# Patient Record
Sex: Female | Born: 1975
Health system: Southern US, Community
[De-identification: ages and names within clinical notes are randomized; demographics above are authoritative.]

## PROBLEM LIST (undated history)

## (undated) DIAGNOSIS — G8929 Other chronic pain: Secondary | ICD-10-CM

## (undated) DIAGNOSIS — S6980XA Other specified injuries of unspecified wrist, hand and finger(s), initial encounter: Secondary | ICD-10-CM

## (undated) DIAGNOSIS — I951 Orthostatic hypotension: Secondary | ICD-10-CM

## (undated) DIAGNOSIS — T4145XA Adverse effect of unspecified anesthetic, initial encounter: Secondary | ICD-10-CM

## (undated) DIAGNOSIS — M549 Dorsalgia, unspecified: Secondary | ICD-10-CM

## (undated) DIAGNOSIS — R51 Headache: Secondary | ICD-10-CM

## (undated) DIAGNOSIS — I499 Cardiac arrhythmia, unspecified: Secondary | ICD-10-CM

## (undated) DIAGNOSIS — G90A Postural orthostatic tachycardia syndrome (POTS): Secondary | ICD-10-CM

## (undated) DIAGNOSIS — E039 Hypothyroidism, unspecified: Secondary | ICD-10-CM

## (undated) DIAGNOSIS — E7800 Pure hypercholesterolemia, unspecified: Secondary | ICD-10-CM

## (undated) DIAGNOSIS — Z9889 Other specified postprocedural states: Secondary | ICD-10-CM

## (undated) DIAGNOSIS — R0683 Snoring: Secondary | ICD-10-CM

## (undated) DIAGNOSIS — K219 Gastro-esophageal reflux disease without esophagitis: Secondary | ICD-10-CM

## (undated) DIAGNOSIS — I498 Other specified cardiac arrhythmias: Secondary | ICD-10-CM

## (undated) DIAGNOSIS — T411X5A Adverse effect of intravenous anesthetics, initial encounter: Secondary | ICD-10-CM

## (undated) DIAGNOSIS — R000 Tachycardia, unspecified: Secondary | ICD-10-CM

## (undated) DIAGNOSIS — H52209 Unspecified astigmatism, unspecified eye: Secondary | ICD-10-CM

## (undated) DIAGNOSIS — R112 Nausea with vomiting, unspecified: Secondary | ICD-10-CM

## (undated) DIAGNOSIS — T8859XA Other complications of anesthesia, initial encounter: Secondary | ICD-10-CM

## (undated) DIAGNOSIS — R05 Cough: Secondary | ICD-10-CM

## (undated) HISTORY — PX: WRIST SURGERY: SHX841

## (undated) HISTORY — PX: BREAST BIOPSY: SHX20

---

## 1998-08-30 ENCOUNTER — Inpatient Hospital Stay (HOSPITAL_COMMUNITY): Admission: AD | Admit: 1998-08-30 | Discharge: 1998-08-30 | Payer: Self-pay | Admitting: Obstetrics

## 1998-09-03 ENCOUNTER — Inpatient Hospital Stay (HOSPITAL_COMMUNITY): Admission: AD | Admit: 1998-09-03 | Discharge: 1998-09-03 | Payer: Self-pay | Admitting: Obstetrics & Gynecology

## 1999-05-28 ENCOUNTER — Other Ambulatory Visit: Admission: RE | Admit: 1999-05-28 | Discharge: 1999-05-28 | Payer: Self-pay | Admitting: Obstetrics & Gynecology

## 2000-06-24 ENCOUNTER — Other Ambulatory Visit: Admission: RE | Admit: 2000-06-24 | Discharge: 2000-06-24 | Payer: Self-pay | Admitting: Obstetrics & Gynecology

## 2000-09-29 ENCOUNTER — Emergency Department (HOSPITAL_COMMUNITY): Admission: EM | Admit: 2000-09-29 | Discharge: 2000-09-30 | Payer: Self-pay | Admitting: Emergency Medicine

## 2000-09-30 ENCOUNTER — Encounter: Payer: Self-pay | Admitting: Emergency Medicine

## 2002-10-25 ENCOUNTER — Other Ambulatory Visit: Admission: RE | Admit: 2002-10-25 | Discharge: 2002-10-25 | Payer: Self-pay | Admitting: Obstetrics and Gynecology

## 2003-04-13 ENCOUNTER — Inpatient Hospital Stay (HOSPITAL_COMMUNITY): Admission: AD | Admit: 2003-04-13 | Discharge: 2003-04-13 | Payer: Self-pay | Admitting: Obstetrics and Gynecology

## 2003-04-20 ENCOUNTER — Inpatient Hospital Stay (HOSPITAL_COMMUNITY): Admission: AD | Admit: 2003-04-20 | Discharge: 2003-04-20 | Payer: Self-pay | Admitting: Obstetrics and Gynecology

## 2003-04-26 ENCOUNTER — Inpatient Hospital Stay (HOSPITAL_COMMUNITY): Admission: AD | Admit: 2003-04-26 | Discharge: 2003-04-26 | Payer: Self-pay | Admitting: *Deleted

## 2003-05-02 ENCOUNTER — Inpatient Hospital Stay (HOSPITAL_COMMUNITY): Admission: AD | Admit: 2003-05-02 | Discharge: 2003-05-07 | Payer: Self-pay | Admitting: Obstetrics and Gynecology

## 2003-05-08 ENCOUNTER — Encounter: Admission: RE | Admit: 2003-05-08 | Discharge: 2003-06-07 | Payer: Self-pay | Admitting: Obstetrics and Gynecology

## 2003-06-13 ENCOUNTER — Other Ambulatory Visit: Admission: RE | Admit: 2003-06-13 | Discharge: 2003-06-13 | Payer: Self-pay | Admitting: Obstetrics and Gynecology

## 2003-07-06 ENCOUNTER — Encounter: Admission: RE | Admit: 2003-07-06 | Discharge: 2003-08-05 | Payer: Self-pay | Admitting: Obstetrics and Gynecology

## 2003-09-05 ENCOUNTER — Encounter: Admission: RE | Admit: 2003-09-05 | Discharge: 2003-10-05 | Payer: Self-pay | Admitting: Obstetrics and Gynecology

## 2003-11-05 ENCOUNTER — Encounter: Admission: RE | Admit: 2003-11-05 | Discharge: 2003-12-05 | Payer: Self-pay | Admitting: Obstetrics and Gynecology

## 2003-12-06 ENCOUNTER — Encounter: Admission: RE | Admit: 2003-12-06 | Discharge: 2004-01-05 | Payer: Self-pay | Admitting: Obstetrics and Gynecology

## 2004-02-05 ENCOUNTER — Encounter: Admission: RE | Admit: 2004-02-05 | Discharge: 2004-03-06 | Payer: Self-pay | Admitting: Obstetrics and Gynecology

## 2004-04-06 ENCOUNTER — Encounter: Admission: RE | Admit: 2004-04-06 | Discharge: 2004-05-06 | Payer: Self-pay | Admitting: Obstetrics and Gynecology

## 2004-05-07 ENCOUNTER — Encounter: Admission: RE | Admit: 2004-05-07 | Discharge: 2004-06-06 | Payer: Self-pay | Admitting: Obstetrics and Gynecology

## 2004-07-05 ENCOUNTER — Encounter: Admission: RE | Admit: 2004-07-05 | Discharge: 2004-07-16 | Payer: Self-pay | Admitting: Obstetrics and Gynecology

## 2004-08-03 ENCOUNTER — Encounter: Admission: RE | Admit: 2004-08-03 | Discharge: 2004-08-03 | Payer: Self-pay | Admitting: *Deleted

## 2004-08-04 ENCOUNTER — Emergency Department (HOSPITAL_COMMUNITY): Admission: EM | Admit: 2004-08-04 | Discharge: 2004-08-04 | Payer: Self-pay | Admitting: Emergency Medicine

## 2004-09-11 ENCOUNTER — Other Ambulatory Visit: Admission: RE | Admit: 2004-09-11 | Discharge: 2004-09-11 | Payer: Self-pay | Admitting: Obstetrics and Gynecology

## 2004-10-24 ENCOUNTER — Ambulatory Visit: Payer: Self-pay | Admitting: Cardiology

## 2004-11-30 ENCOUNTER — Ambulatory Visit: Payer: Self-pay | Admitting: Cardiology

## 2005-07-04 ENCOUNTER — Inpatient Hospital Stay (HOSPITAL_COMMUNITY): Admission: AD | Admit: 2005-07-04 | Discharge: 2005-07-04 | Payer: Self-pay | Admitting: Obstetrics and Gynecology

## 2005-07-30 ENCOUNTER — Inpatient Hospital Stay (HOSPITAL_COMMUNITY): Admission: AD | Admit: 2005-07-30 | Discharge: 2005-07-30 | Payer: Self-pay | Admitting: Obstetrics & Gynecology

## 2006-02-06 ENCOUNTER — Inpatient Hospital Stay (HOSPITAL_COMMUNITY): Admission: AD | Admit: 2006-02-06 | Discharge: 2006-02-09 | Payer: Self-pay | Admitting: Obstetrics and Gynecology

## 2006-03-25 HISTORY — PX: CARDIAC CATHETERIZATION: SHX172

## 2006-08-12 ENCOUNTER — Observation Stay (HOSPITAL_COMMUNITY): Admission: EM | Admit: 2006-08-12 | Discharge: 2006-08-13 | Payer: Self-pay | Admitting: Emergency Medicine

## 2006-08-12 ENCOUNTER — Ambulatory Visit: Payer: Self-pay | Admitting: Cardiovascular Disease

## 2006-09-18 ENCOUNTER — Ambulatory Visit: Payer: Self-pay | Admitting: Cardiology

## 2007-10-29 ENCOUNTER — Encounter: Admission: RE | Admit: 2007-10-29 | Discharge: 2007-10-29 | Payer: Self-pay | Admitting: Cardiology

## 2009-05-01 ENCOUNTER — Ambulatory Visit (HOSPITAL_COMMUNITY): Admission: RE | Admit: 2009-05-01 | Discharge: 2009-05-01 | Payer: Self-pay | Admitting: Obstetrics and Gynecology

## 2009-05-01 HISTORY — PX: INTRAUTERINE DEVICE INSERTION: SHX323

## 2009-05-01 HISTORY — PX: DILATION AND CURETTAGE OF UTERUS: SHX78

## 2009-05-01 HISTORY — PX: IUD REMOVAL: SHX5392

## 2009-06-06 ENCOUNTER — Encounter: Admission: RE | Admit: 2009-06-06 | Discharge: 2009-06-06 | Payer: Self-pay | Admitting: Allergy

## 2010-06-13 LAB — CBC
HCT: 42.6 % (ref 36.0–46.0)
MCV: 93.6 fL (ref 78.0–100.0)
Platelets: 246 10*3/uL (ref 150–400)

## 2010-06-13 LAB — PREGNANCY, URINE: Preg Test, Ur: NEGATIVE

## 2010-07-24 ENCOUNTER — Emergency Department (HOSPITAL_COMMUNITY): Payer: 59

## 2010-07-24 ENCOUNTER — Emergency Department (HOSPITAL_COMMUNITY)
Admission: EM | Admit: 2010-07-24 | Discharge: 2010-07-25 | Disposition: A | Discharge status: Home / Self Care | Payer: 59 | Attending: Emergency Medicine | Admitting: Emergency Medicine

## 2010-07-24 DIAGNOSIS — Z79899 Other long term (current) drug therapy: Secondary | ICD-10-CM | POA: Insufficient documentation

## 2010-07-24 DIAGNOSIS — R0602 Shortness of breath: Secondary | ICD-10-CM | POA: Insufficient documentation

## 2010-07-24 DIAGNOSIS — K219 Gastro-esophageal reflux disease without esophagitis: Secondary | ICD-10-CM | POA: Insufficient documentation

## 2010-07-24 DIAGNOSIS — R0609 Other forms of dyspnea: Secondary | ICD-10-CM | POA: Insufficient documentation

## 2010-07-24 DIAGNOSIS — R0989 Other specified symptoms and signs involving the circulatory and respiratory systems: Secondary | ICD-10-CM | POA: Insufficient documentation

## 2010-07-24 DIAGNOSIS — R079 Chest pain, unspecified: Secondary | ICD-10-CM | POA: Insufficient documentation

## 2010-07-24 LAB — POCT I-STAT, CHEM 8
BUN: 10 mg/dL (ref 6–23)
Calcium, Ion: 1.19 mmol/L (ref 1.12–1.32)
Chloride: 107 mEq/L (ref 96–112)
Glucose, Bld: 92 mg/dL (ref 70–99)
Hemoglobin: 15 g/dL (ref 12.0–15.0)
Potassium: 4.1 mEq/L (ref 3.5–5.1)
Sodium: 140 mEq/L (ref 135–145)

## 2010-07-24 LAB — POCT PREGNANCY, URINE: Preg Test, Ur: NEGATIVE

## 2010-07-24 LAB — POCT CARDIAC MARKERS
CKMB, poc: <1 ng/mL — ABNORMAL LOW (ref 1.0–8.0)
Myoglobin, poc: 28.9 ng/mL (ref 12–200)
Troponin i, poc: <0.05 ng/mL (ref 0.00–0.09)

## 2010-07-24 LAB — DIFFERENTIAL
Basophils Absolute: 0 10*3/uL (ref 0.0–0.1)
Basophils Relative: 1 % (ref 0–1)
Eosinophils Absolute: 0.2 10*3/uL (ref 0.0–0.7)
Eosinophils Relative: 3 % (ref 0–5)
Monocytes Absolute: 0.5 10*3/uL (ref 0.1–1.0)
Monocytes Relative: 7 % (ref 3–12)
Neutro Abs: 4.2 10*3/uL (ref 1.7–7.7)

## 2010-07-24 LAB — D-DIMER, QUANTITATIVE: D-Dimer, Quant: 0.67 ug/mL-FEU — ABNORMAL HIGH (ref 0.00–0.48)

## 2010-07-24 MED ORDER — IOHEXOL 300 MG/ML  SOLN
100.0000 mL | Freq: Once | INTRAMUSCULAR | Status: AC | PRN
  Administered 2010-07-24: 100 mL via INTRAVENOUS

## 2010-08-07 NOTE — Assessment & Plan Note (Signed)
[facility]                            CARDIOLOGY OFFICE NOTE   CHAYLEE, EHRSAM                       MRN:          [id number]  DATE:09/18/2006                            DOB:          03-26-1975    HISTORY OF PRESENT ILLNESS:  Mrs. Selmer returns today post  hospitalization.  Please see the discharge summary for complete details.  She ruled out for myocardial infarction.  Her EKG which apparently shows  some T wave inversion.  She did one on herself at the EMS station,  resolved to normal.   She had an exercise rest stress Myoview which was remarkable for an EF  of 57% with no scar ischemia.  Her stress portion indicated some  physical deconditioning, but appropriate blood pressure response to  exercise.   She also had a 2D echocardiogram which showed an EF of 65% with no LVH  and no pericardial effusion or valvular abnormalities.   She was seen by Dr. Anne Hahn for some dizziness and migraine headaches.  She was placed on Lyrica 50 mg q.h.s.   She would like to take Maxalt p.r.n. for migraines.  She has had chest  pain with Maxalt on only one occasion, though she has taken it multiple  times.   We talked at length about the risk of potential coronary spasm with  Maxalt, but I told her it was probably okay to take as long as she was  not having chest pain.   She had multiple questions about other issues including the dizzy  spells, tingling in her fingers which I think is probably  hyperventilation.  We talked about stress reduction.  We talked about  tobacco cessation at length.  At the end of her visit, we prescribed  Chantix starter pack.   PHYSICAL EXAMINATION:  VITAL SIGNS:  Blood pressure was 140/89, today  her pulse was 96 and regular.  Her weight was 173.  HEART:  Her exam was unremarkable in that she had a normal S2, S2.  LUNGS:  Clear.  Carotids were full.  There was no thyromegaly.  EXTREMITIES:  No edema.  Pulses were  present.   ASSESSMENT/RECOMMENDATIONS:  At this point in time, I have offered  reassurance.  I have asked her to consider stress reduction counseling.  I think Maxalt is reasonable as long as  it is not causing any chest pain.  I have asked her to get into a  regular exercise program to improve her conditioning.  I plan on seeing  her back on a p.r.n. basis.     Thomas C. Daleen Squibb, MD, [facility]  Electronically Signed    TCW/MedQ  DD: 09/18/2006  DT: 09/19/2006  Job #: [id number]   cc:   Marcelino Duster L. Vincente Poli, M.D.

## 2010-08-07 NOTE — Discharge Summary (Signed)
[name], [name] NO.:  [id number]   MEDICAL RECORD NO.:  [id number]          PATIENT TYPE:  INP   LOCATION:  6526                         FACILITY:  [facility]   PHYSICIAN:  Bevelyn Buckles. Bensimhon, MDDATE OF BIRTH:  Mar 17, 1976   DATE OF ADMISSION:  08/12/2006  DATE OF DISCHARGE:  08/13/2006                               DISCHARGE SUMMARY   PROCEDURES:  1. Exercise treadmill Myoview.  2. A 2-D echocardiogram.   PRIMARY DIAGNOSES:  1. Chest pain.  2. Cardiac enzymes negative for an myocardial infarction.  3. Exercise Myoview negative for scar/ischemia.   SECONDARY DIAGNOSES:  1. Preserved left ventricular function with an ejection fraction of      57% at Myoview.  2. Dizziness.  3. History of migraines.  4. History of lower extremity edema.  5. Status post C-section x2.  6. Ongoing tobacco use.  7. History of palpitations and tachycardia.  8. Obesity.  9. Gastroesophageal reflux disease.  10.Family history of premature coronary artery disease.   TIME AT DISCHARGE:  33 minutes.   HOSPITAL COURSE:  [name] is a 35 year old female with no previous  history of coronary artery disease.  She was evaluated by Dr. Daleen Squibb for  palpitations, but no medical therapy was indicated.  She has had issues  with dizziness as well as weakness, fatigue, dyspnea on exertion; and  daily headaches.  She has seen Dr. Orlin Hilding in the past.  She has also  been evaluated by staff at [facility].  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   Her cardiac enzymes were negative for MI.  A stress Myoview was  performed which showed no scar or ischemia and an EF of 57%.  There was  concern for LVH on her EKG; and an echocardiogram was performed which  showed an EF of 65% with normal wall thickness and no pericardial  effusion as well as no valvular  abnormalities.   [name] was seen by neurology because of the headaches.  Dr. Anne Hahn  felt that Lyrica 50 mg q.h.s. was appropriate therapy for this; and this  was started.  She is tolerating this well.  She had also been given a  prescription for Topamax, in the past; but states that she had not  gotten this filled as yet.  She understands that she can take one or the  other, but should not combine them.  She will follow up with Dr. Orlin Hilding  as scheduled.  On 08/13/2006 [name] was feeling better and had no  further episodes of chest pain.  She had no chest pain during the stress  portion of her Myoview.  She was evaluated by Dr. Gala Romney and  considered stable for discharge with outpatient follow-up arranged.   LABORATORY VALUES:  Hemoglobin 14.5, hematocrit 42.8, WBC 6.9, platelets  282.  D-dimer less than 0.22.  Sodium 138, potassium 3.5, chloride 104,  CO2 27, BUN 7, creatinine 0.75, glucose  100.  TSH 2.748.  T4 7.0.   DISCHARGE INSTRUCTIONS:  1. Her activity level is to be increased gradually.  2. She is to stick to a low sodium heart healthy diet.  3. She is to followup with Dr. Daleen Squibb on June 6 at 4:00 p.m.  4. She is to followup with Harrietta Guardian, [facility] at [facility].  5. She is to followup with Dr. Orlin Hilding as well.   DISCHARGE MEDICATIONS:  1. Prilosec 40 mg q.h.s.  2. Lyrica 50 mg q.h.s.  3. Topamax as previously prescribed.  4. Aspirin 81 mg daily.  5. Nicotine patch 14 mg daily, and tapered to discontinuation.  6. HCTZ and Motrin as prior to admission.  7. She understands that she is not to use tobacco or caffeine.      Theodore Demark, PA-C      Bevelyn Buckles. Bensimhon, MD  Electronically Signed    RB/MEDQ  D:  08/13/2006  T:  08/13/2006  Job:  [id number]   cc:   Santina Evans A. Orlin Hilding, M.D.  [facility] Harrietta Guardian, [facility]

## 2010-08-07 NOTE — H&P (Signed)
[name], [name] NO.:  [id number]   MEDICAL RECORD NO.:  [id number]          PATIENT TYPE:  INP   LOCATION:  1846                         FACILITY:  [facility]   PHYSICIAN:  Kara Pick. 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  She came to the emergency room with  multiple symptomatic complaints, these included chest pain, the pain is  atypical.  It has lasted for about a month and a half.  It increased  over the last week.  It is substernal.  It has radiated to the left side  of her chest.  She has done multiple EKGs on herself in the last week or  two.  I reviewed four of them, they were all totally normal.  She was  concerned about an inverted T-wave in V1 which was normal.  She was also  concerned about some nonspecific changes in lead 3.   The patient also has had some shortness of breath.  The shortness of  breath is exertional.  There is no pleuritic component.  There has been  no fever or sputum production.  She has no chronic lung disease and is a  nonsmoker.  The shortness of breath is not associated with chest pain.  She seems to think that this is a new symptom in the last week.   She has also had dizziness.  She has seen Dr. __________ and some of the  doctors at Urgent Care for this.  She was to follow up with neurology  [name].   The dizziness is not vertigo, she feels like she is going to fall one  way or the other when she closes her eyes.  There has been no associated  nausea or vomiting.  She does have a history of migraines.  She  essentially says she has a headache every day since the birth of her  second child six months ago.  She had been on Topamax in the past but  stopped this and takes Maxalt now for her migraines.   REVIEW OF  SYSTEMS:  Otherwise unremarkable.   MEDICATIONS:  Her only medications are Prilosec p.r.n.,  hydrochlorothiazide p.r.n. for edema and Motrin for pain.   ALLERGIES:  No known drug allergies.   PAST MEDICAL HISTORY:  Primarily remarkable for two c-sections which  were uncomplicated.   SOCIAL HISTORY:  The patient is happily married.  She has a 44-year-old  and a 72-month-old.  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  Her  mother was with her today.  The patient does not drink or smoke.  She is  fairly sedentary outside her work.   FAMILY HISTORY:  Noncontributory.   About a year and a half ago she had some chest pain.  She apparently was  referred to [facility] for a 2D echocardiogram.  She says that this  was normal.  She was subsequently referred to [name] for workup.  At  that time she had some palpitations and tachycardia.  Again, as far as I  can tell the workup was negative  but we will need to see if we can  office records on that.   PHYSICAL EXAMINATION:  GENERAL:  The patient's exam is remarkable for a  fairly young female in no distress.  Affect is appropriate.  VITAL SIGNS:  Blood pressure 120/70, she is not postural.  Pulse is 70  and regular.  She is afebrile.  Respiratory rate is 14.  Weight is not  recorded.  HEENT:  Normal.  NECK:  Supple.  There is no thyromegaly or lymphadenopathy, jugular  venous pressure elevation or bruits.  LUNGS:  Clear with no wheezing and normal diaphragmatic motion.  There  is an S1, S2 with normal heart sounds.  PMI is nonpalpable.  ABDOMEN:  Benign.  There is no epigastric pain, no hepatosplenomegaly,  no hepatojugular reflux.  There is no abdominal aortic aneurysm and no  bruits.  EXTREMITIES:  Distal pulses are intact.  No edema.  NEUROLOGIC:  Nonfocal.  There are no varicosities or lymphedema.  MUSCULOSKELETAL:  Normal.  There is no weakness.   DIAGNOSTIC STUDIES:  Her EKG is entirely normal.  Her chest x-ray shows  no  active disease.   LABORATORY DATA:  Pending.  Cardiac enzymes are negative.   IMPRESSION:  The patient presents with multiple issues.  Her chest pain  is atypical.  Her EKGs are normal.  I think it is reasonable for her to  have a stress Myoview study.  Unfortunately we do not have a tech right  now who can do a cardiac CT on her and there is no physician available  tomorrow.   I will not start her on any anticoagulants.  We will treat her pain with  Motrin and aspirin.   In regards to the patient's dizziness, she needs a head CT to rule out  any CNS mass.  We will order this tonight without contrast.   We will ask neurology to see her in-house since she has had constant  headaches for six months and has a history of migraines and has been  having dizziness.   In regards to her shortness of breath, it appears functional.  We will  check a 2D echocardiogram to make sure that there is no left ventricular  dysfunction or occult valvular disease.   Hopefully, neurology can see the patient and we can do her CT scan  tonight and functional assessment of her heart tomorrow with possible  discharge later tomorrow.   The patient and her mom seem happy with these plans.           ______________________________  Kara Pick Eden Emms, MD, [facility]     PCN/MEDQ  D:  08/12/2006  T:  08/12/2006  Job:  [id number]
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
## 2010-08-10 NOTE — Op Note (Signed)
NAMELAKEYN, DOKKEN                ACCOUNT NO.:  [id number]   MEDICAL RECORD NO.:  [id number]          PATIENT TYPE:  INP   LOCATION:  9104                          FACILITY:  [facility]   PHYSICIAN:  Michelle L. Grewal, M.D.DATE OF BIRTH:  March 21, 1976   DATE OF PROCEDURE:  02/06/2006  DATE OF DISCHARGE:  02/09/2006                               OPERATIVE REPORT   PREOPERATIVE DIAGNOSES:  1. Intrauterine pregnancy at 38 weeks.  2. Hyperemesis.  3. Previous cesarean section.   POSTOPERATIVE DIAGNOSES:  1. Intrauterine pregnancy at 38 weeks.  2. Hyperemesis.  3. Previous cesarean section.   PROCEDURE:  Repeat low transverse cesarean section.   SURGEON:  Dr. Vincente Poli.   ANESTHESIA:  Spinal.   SPECIMENS:  Female infant.  Apgars 9 at one minute, 9 at five minutes.   COMPLICATIONS:  None.   PROCEDURE:  Patient was taken to the operating room.  She was given her  spinal without incident.  She was prepped and draped in the usual  sterile fashion. A Foley catheter was inserted.  A low transverse  incision was made and carried down to the fascia.  The fascia was scored  in the midline and extended laterally.  The rectus muscles were  separated in the midline.  The peritoneum was entered bluntly.  The  peritoneal incision was then stretched.  The bladder blade was inserted.  The lower uterine segment was identified.  The bladder flap was  developed sharply and then digitally.  The bladder blade was readjusted.  A low transverse incision was made in the uterus.  The uterus was  entered using a hemostat.  The baby was a female infant and delivered  quite easily.  She weighted 7 pounds 0 ounces.  She was delivered at  1339.  Apgars were 9 at one minute and 9 at five minutes.  The cord was  clamped and cut, and the baby was handed to the waiting neonatology team  and then taken to the newborn nursery.  The placenta was manually  removed, noted to be normal intact with three-vessel cord.  Uterus  was  exteriorized and cleared of all clots and debris.  The uterine incision  was closed in one layer using 0 chromic continuous running locked  stitch.  It was hemostatic.  The uterus was returned to the abdomen.  Irrigation was performed.  Hemostasis was again noted to be excellent.  The peritoneum and rectus muscles were reapproximated using 0 Vicryl.  The fascia was then closed  using 0 Vicryl starting at each corner and meeting in the midline.  After irrigation of the subcutaneous layers, the skin was closed with  staples.  All sponge, lap and instrument counts were correct x2.  The  patient went to recovery room in stable condition.      Michelle L. Vincente Poli, M.D.  Electronically Signed     MLG/MEDQ  D:  03/03/2006  T:  03/03/2006  Job:  [id number]
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
## 2010-08-10 NOTE — Discharge Summary (Signed)
[name], [name] NO.:  [id number]   MEDICAL RECORD NO.:  [id number]          PATIENT TYPE:  INP   LOCATION:  9104                          FACILITY:  [facility]   PHYSICIAN:  Dineen Kid. Rana Snare, M.D.    DATE OF BIRTH:  [birth year]   DATE OF ADMISSION:  02/06/2006  DATE OF DISCHARGE:  02/09/2006                               DISCHARGE SUMMARY   ADMITTING DIAGNOSIS:  1. Intrauterine pregnancy at 12 weeks estimated gestational age.  2. Previous cesarean section, desired repeat.   DISCHARGE DIAGNOSES:  1. Status post low transverse cesarean section.  2. Viable female infant.   PROCEDURE:  Repeat low transverse cesarean section.   REASON FOR ADMISSION:  Please see written H&P.   HOSPITAL COURSE:  The patient is a 35 year old gravida 6, para 1 that  presented to [facility] for a scheduled cesarean  section.  The patient had had a previous cesarean section and desires  repeat.  On the morning of admission, the patient was taken to the  operating room where spinal anesthesia was administered without  difficulty.  A low transverse incision was made with the delivery of a  viable female infant, weighing 7 pounds 0 ounces, with Apgar's of 9 at  one minute and 9 at five minutes.  The patient tolerated the procedure  well and was taken to recovery room in stable condition.  Postoperative day #1, the patient was without complaint.  Vital signs  remained stable.  Abdomen soft with good return of bowel function.  Fundus is firm and nontender.  Abdominal dressing was noted with a scant  amount of old drainage noted on bandage.  Foley had been discontinued.  Laboratory findings revealed hemoglobin of 9.3, platelet count of  205,000, WBC count 10.3.  On postoperative day #2, the patient did complain of some nasal  stuffiness.  Vital signs were otherwise stable.  She was afebrile.  Abdomen soft.  Fundus firm and nontender.  Abdominal dressing had been  removed  revealing an incision that was clean, dry and intact.  The  patient was ambulating well, tolerating a regular diet without complaint  of nausea, vomiting.  Postoperative day #3, the patient was without complaint.  Vital signs  remained stable.  She was afebrile.  Fundus firm and nontender.  Incision was clean, dry and intact.  Staples removed and the patient was  discharged home.   CONDITION ON DISCHARGE:  Good.   DIET:  Regular as tolerated.   ACTIVITY:  No heavy lifting, no driving x2 weeks, no vaginal entry.   FOLLOWUP:  The patient is to follow up in the office in 1-2 weeks for an  incision check.  She is to call for temperature greater than 100  degrees, persistent nausea, vomiting, heavy vaginal bleeding, and/or  redness or drainage from incisional site.   DISCHARGE MEDICATIONS:  1. Tylox, #30, one p.o. every four to six hours.  2. Motrin 600 mg every 6 hours.  3. Prenatal vitamins one p.o. daily.  4. Colace one p.o. daily p.r.n.      Julio Sicks,  N.P.      Dineen Kid. Rana Snare, M.D.  Electronically Signed    CC/MEDQ  D:  02/25/2006  T:  02/25/2006  Job:  [id number]

## 2010-08-10 NOTE — Discharge Summary (Signed)
[name], [name]                          ACCOUNT NO.:  [id number]   MEDICAL RECORD NO.:  [id number]                   PATIENT TYPE:  INP   LOCATION:  9145                                 FACILITY:  [facility]   PHYSICIAN:  Guy Sandifer. Henderson Cloud, M.D.              DATE OF BIRTH:  03-30-1975   DATE OF ADMISSION:  05/02/2003  DATE OF DISCHARGE:  05/07/2003                                 DISCHARGE SUMMARY   ADMITTING DIAGNOSES:  1. Intrauterine pregnancy at 37 weeks estimated gestational age.  2. Induction of labor secondary to pregnancy-induced hypertension.   DISCHARGE DIAGNOSES:  1. Status post low transverse cesarean section secondary to failure to     progress.  2. Viable female infant.   PROCEDURE:  Primary low transverse cesarean section.   REASON FOR ADMISSION:  Please see dictated H&P.   HOSPITAL COURSE:  The patient was a 35 year old gravida 5 para 0 that was  admitted to [facility] at 37 weeks estimated gestational  age for an induction of labor due to elevated blood pressure.  The patient  had been followed closely in the office over the last 2-3 weeks and was  noted to have blood pressures of 154 over 70s to 80s with trace of  proteinuria.  The patient had been placed on modified bedrest for mild  toxemia.  She presents on admission for a two-stage induction of labor.  On  the evening of her admission Cytotec was administered for cervical ripening.  Cervix was known to be closed, 30% effaced, vertex was high in the pelvis.  Fetal heart tones were reactive.  PIH labs were drawn which were within  normal limits.  On the following morning the patient was somewhat  comfortable, vital signs were stable, fetal heart tones continued to be  reactive.  Contractions were noted to be irregular.  Cervix was now dilated  to 1 cm, 50% effaced, with vertex at a -2 station.  Artificial rupture of  membranes was performed revealing clear fluid.  Labor was now augmented with  Pitocin.  Epidural was placed for the patient's comfort.  The patient  labored all day without significant change in cervix.  The patient did  progress to 3 cm dilated; however, no further progression after 4 hours with  adequate labor as measured by [place] units.  The decision was then made  to proceed with a low transverse cesarean section.  The patient was then  transferred to the operating room where epidural was dosed to an adequate  surgical level. A  low transverse incision was made with the delivery of a  viable female infant weighing 7 pounds 1 ounce with Apgars of 9 at one minute  and 9 at five minutes.  The patient tolerated the procedure well and was  taken to the recovery room in stable condition.  On postoperative day #1  vital signs were stable; she was  afebrile.  Abdomen was soft with good  return of bowel function.  Abdominal dressing was noted to be clean, dry,  and intact.  Labs revealed hemoglobin of 7.2.  The patient was placed on  iron supplementation.  On postoperative day #2 the patient was without  complaint.  Vital signs were stable; she was afebrile.  Fundus was firm and  nontender.  Incision was clean, dry, and intact.  The patient was ambulating  well.  On postoperative day #3 the patient did complain of some headache.  She denied epigastric pain.  Vital signs were stable, blood pressure 152/93.  Abdomen was soft, fundus was firm and nontender.  Incision was clean, dry,  and intact.  Deep tendon reflexes were 2+.  PIH labs were drawn and pain  medication was changed to Vicodin.  On postoperative day #4 the patient did  continue to complain of a headache.  She continued to deny visual  disturbance or right upper quadrant pain.  Vital signs were stable.  Incision was clean, dry, and intact.  Staples were removed and the patient  was discharged home.   CONDITION ON DISCHARGE:  Stable.   DIET:  Regular as tolerated..   ACTIVITY:  No heavy lifting, no driving  x2 weeks, no vaginal entry.   FOLLOW-UP:  The patient is to follow up in the office in 1 week for an  incision check.  She is to call for temperature greater than 100 degrees,  persistent nausea and vomiting, heavy vaginal bleeding, and/or redness or  drainage from the incisional site.   DISCHARGE MEDICATIONS:  1. Vicodin #30 one p.o. 46 p.r.n.  2. Ferrous sulfate 325 mg one p.o. b.i.d. with meals.  3. Prenatal vitamins one p.o. daily.  4. Ibuprofen 600 mg q.6h.  5. Colace one p.o. daily p.r.n.     Julio Sicks, N.P.                        Guy Sandifer. Henderson Cloud, M.D.    CC/MEDQ  D:  06/03/2003  T:  06/03/2003  Job:  [id number]

## 2010-08-10 NOTE — H&P (Signed)
[name], [name] NO.:  [id number]   MEDICAL RECORD NO.:  [id number]                   PATIENT TYPE:   LOCATION:                                       FACILITY:   PHYSICIAN:  Dineen Kid. Rana Snare, M.D.                 DATE OF BIRTH:   DATE OF ADMISSION:  DATE OF DISCHARGE:                                HISTORY & PHYSICAL   HISTORY OF PRESENT ILLNESS:  [name] is a 35 year old G5 P0 A4 at [redacted] weeks  gestational age who presents for induction of labor due to elevated blood  pressures.  Her blood pressures over the last 2-3 weeks have been elevated  to the range of 154 over 70s to 80s with a trace protein.  She has been on  modified bedrest for mild toxemia and presents for two-stage induction.  She  had a ultrasound on May 01, 2003 which shows a vertex fetus with a  biophysical of 8/8 and an amniotic fluid index of 12.1.  Her estimated date  of confinement is May 23, 2003 and her group B strep was done at the  hospital and the results are not immediately available.   PHYSICAL EXAMINATION:  VITAL SIGNS:  Blood pressure 148/82.  HEART:  Regular rate and rhythm.  LUNGS:  Clear to auscultation bilaterally.  ABDOMEN:  Gravid, nontender.  Fundal height is 37 cm.  PELVIC:  Cervix is closed, 30%, and high.   MEDICATIONS:  Prenatal vitamins.   ALLERGIES:  No known drug allergies.   IMPRESSION AND PLAN:  Intrauterine pregnancy at 37 weeks with preeclampsia.  Plan two-stage induction of labor with Cytotec cervical ripening followed by  Pitocin and amniotomy.  Will check preeclampsia labs on admission.  At this  time without CNS irritability will plan to hold off on magnesium sulfate.                                               Dineen Kid Rana Snare, M.D.    DCL/MEDQ  D:  05/02/2003  T:  05/02/2003  Job:  [id number]

## 2010-08-10 NOTE — Op Note (Signed)
[name], [name]                          ACCOUNT NO.:  [id number]   MEDICAL RECORD NO.:  [id number]                   PATIENT TYPE:  INP   LOCATION:  9145                                 FACILITY:  [facility]   PHYSICIAN:  Michelle L. Vincente Poli, M.D.            DATE OF BIRTH:  [birth year]   DATE OF PROCEDURE:  05/03/2003  DATE OF DISCHARGE:                                 OPERATIVE REPORT   PREOPERATIVE DIAGNOSIS:  1. Intrauterine pregnancy at term.  2. Pregnancy induced hypertension.  3. Arrested dilation at 3 cm.   POSTOPERATIVE DIAGNOSIS:  1. Intrauterine pregnancy at term.  2. Pregnancy induced hypertension.  3. Arrested dilation at 3 cm.   PROCEDURE:  Primary low transverse cesarean section.   SURGEON:  Michelle L. Vincente Poli, M.D.   ANESTHESIA:  Epidural.   ESTIMATED BLOOD LOSS:  700 mL.   FINDINGS:  A female infant with Apgars 8 at 1 minute and 9 at 5 minutes in  cephalic presentation.   PROCEDURE:  The patient was taken to the operating room where epidural was  dosed and found to be adequate.  She was then prepped and draped in the  usual sterile fashion.  A Foley catheter was inserted into the bladder.  After a sterile drape was applied, scalpel was used to make a low transverse  incision and carried down to the fascia.  The fascia was scored in the  midline and extended laterally.  The Pfannenstiel incision was developed.  The rectus muscles were separated in the midline and the peritoneum was  entered bluntly.  The peritoneal incision was stretched.  The bladder blade  was inserted and the lower uterine segment was identified.  The bladder  blade was readjusted.  A low transverse incision was made in the uterus and  the uterus was entered with a hemostat.  The amniotic fluid was clear.  The  baby was delivered with one easy pull of the vacuum, was a female infant with  Apgars 8 at 1 minute and 9 at 5 minutes.  The cord was clamped and cut, the  baby was handed to the  awaiting pediatrician.  The placenta was manually  removed, noted to be normal and intact.  The uterus was exteriorized and was  cleared of all clots and debris.  The uterine incision was closed in a  single layer using 0 chromic in a continuous running locked stitch.  A  second layer was used, as well.  It was hemostatic.  The uterus was returned  to the abdomen.  Irrigation was performed, hemostasis was noted.  The  peritoneum was closed using 0 Vicryl in a continuous running stitch, the  rectus muscles were reapproximated using 0 Vicryl.  The fascia was closed  using 0 Vicryl continuous  running stitch starting at each corner and meeting in the midline.  After  irrigation of the subcutaneous layer, the skin was closed  with staples.  All  sponge, lap, and instrument counts were correct x 2.  The patient tolerated  the procedure well and went to the recovery room in stable condition.                                               Michelle L. Vincente Poli, M.D.    Florestine Avers  D:  05/03/2003  T:  05/03/2003  Job:  [id number]

## 2011-06-13 ENCOUNTER — Other Ambulatory Visit: Payer: Self-pay | Admitting: Obstetrics and Gynecology

## 2011-08-06 ENCOUNTER — Other Ambulatory Visit: Payer: Self-pay | Admitting: Surgery

## 2011-12-19 ENCOUNTER — Other Ambulatory Visit: Payer: Self-pay | Admitting: Orthopedic Surgery

## 2011-12-24 ENCOUNTER — Encounter (HOSPITAL_BASED_OUTPATIENT_CLINIC_OR_DEPARTMENT_OTHER): Payer: Self-pay | Admitting: *Deleted

## 2011-12-24 DIAGNOSIS — S6980XA Other specified injuries of unspecified wrist, hand and finger(s), initial encounter: Secondary | ICD-10-CM

## 2011-12-24 DIAGNOSIS — R059 Cough, unspecified: Secondary | ICD-10-CM

## 2011-12-24 HISTORY — DX: Cough, unspecified: R05.9

## 2011-12-24 HISTORY — DX: Other specified injuries of unspecified wrist, hand and finger(s), initial encounter: S69.80XA

## 2011-12-24 NOTE — Pre-Procedure Instructions (Addendum)
Office note and any cardiac testing req. from  [facility] to come for BMET and EKG

## 2011-12-26 ENCOUNTER — Encounter (HOSPITAL_BASED_OUTPATIENT_CLINIC_OR_DEPARTMENT_OTHER)
Admission: RE | Admit: 2011-12-26 | Discharge: 2011-12-26 | Disposition: A | Discharge status: Home / Self Care | Payer: Worker's Compensation | Source: Ambulatory Visit | Attending: Orthopedic Surgery | Admitting: Orthopedic Surgery

## 2011-12-26 LAB — BASIC METABOLIC PANEL
CO2: 23 mEq/L (ref 19–32)
Calcium: 9.7 mg/dL (ref 8.4–10.5)
Creatinine, Ser: 0.84 mg/dL (ref 0.50–1.10)
Glucose, Bld: 117 mg/dL — ABNORMAL HIGH (ref 70–99)

## 2011-12-31 ENCOUNTER — Encounter (HOSPITAL_BASED_OUTPATIENT_CLINIC_OR_DEPARTMENT_OTHER)
Admission: RE | Disposition: A | Discharge status: Home / Self Care | Payer: Self-pay | Source: Ambulatory Visit | Attending: Orthopedic Surgery

## 2011-12-31 ENCOUNTER — Encounter (HOSPITAL_BASED_OUTPATIENT_CLINIC_OR_DEPARTMENT_OTHER): Payer: Self-pay | Admitting: *Deleted

## 2011-12-31 ENCOUNTER — Ambulatory Visit (HOSPITAL_BASED_OUTPATIENT_CLINIC_OR_DEPARTMENT_OTHER)
Admission: RE | Admit: 2011-12-31 | Discharge: 2012-01-01 | Disposition: A | Discharge status: Home / Self Care | Payer: Worker's Compensation | Source: Ambulatory Visit | Attending: Orthopedic Surgery | Admitting: Orthopedic Surgery

## 2011-12-31 ENCOUNTER — Encounter (HOSPITAL_BASED_OUTPATIENT_CLINIC_OR_DEPARTMENT_OTHER): Payer: Self-pay | Admitting: Anesthesiology

## 2011-12-31 ENCOUNTER — Ambulatory Visit (HOSPITAL_BASED_OUTPATIENT_CLINIC_OR_DEPARTMENT_OTHER): Payer: Worker's Compensation | Admitting: Anesthesiology

## 2011-12-31 DIAGNOSIS — Z01812 Encounter for preprocedural laboratory examination: Secondary | ICD-10-CM | POA: Insufficient documentation

## 2011-12-31 DIAGNOSIS — IMO0002 Reserved for concepts with insufficient information to code with codable children: Secondary | ICD-10-CM | POA: Insufficient documentation

## 2011-12-31 DIAGNOSIS — M24139 Other articular cartilage disorders, unspecified wrist: Secondary | ICD-10-CM | POA: Insufficient documentation

## 2011-12-31 HISTORY — DX: Other chronic pain: G89.29

## 2011-12-31 HISTORY — DX: Dorsalgia, unspecified: M54.9

## 2011-12-31 HISTORY — DX: Cough: R05

## 2011-12-31 HISTORY — DX: Unspecified astigmatism, unspecified eye: H52.209

## 2011-12-31 HISTORY — PX: WRIST ARTHROSCOPY: SHX838

## 2011-12-31 HISTORY — DX: Orthostatic hypotension: I95.1

## 2011-12-31 HISTORY — DX: Tachycardia, unspecified: R00.0

## 2011-12-31 HISTORY — DX: Snoring: R06.83

## 2011-12-31 HISTORY — DX: Headache: R51

## 2011-12-31 HISTORY — DX: Other complications of anesthesia, initial encounter: T88.59XA

## 2011-12-31 HISTORY — DX: Hypothyroidism, unspecified: E03.9

## 2011-12-31 HISTORY — DX: Other specified injuries of unspecified wrist, hand and finger(s), initial encounter: S69.80XA

## 2011-12-31 HISTORY — DX: Gastro-esophageal reflux disease without esophagitis: K21.9

## 2011-12-31 HISTORY — DX: Pure hypercholesterolemia, unspecified: E78.00

## 2011-12-31 HISTORY — DX: Adverse effect of unspecified anesthetic, initial encounter: T41.45XA

## 2011-12-31 HISTORY — DX: Other specified postprocedural states: R11.2

## 2011-12-31 HISTORY — DX: Other specified cardiac arrhythmias: I49.8

## 2011-12-31 HISTORY — DX: Postural orthostatic tachycardia syndrome (POTS): G90.A

## 2011-12-31 HISTORY — DX: Other specified postprocedural states: Z98.890

## 2011-12-31 SURGERY — ARTHROSCOPY, WRIST
Anesthesia: General | Site: Wrist | Laterality: Left | Wound class: Clean

## 2011-12-31 MED ORDER — LACTATED RINGERS IV SOLN
INTRAVENOUS | Status: DC
  Administered 2011-12-31 (×2): via INTRAVENOUS
  Administered 2011-12-31: 10 mL/h via INTRAVENOUS

## 2011-12-31 MED ORDER — OXYCODONE-ACETAMINOPHEN 5-325 MG PO TABS
1.0000 | ORAL_TABLET | ORAL | Status: DC | PRN
  Administered 2012-01-01 (×2): 2 via ORAL

## 2011-12-31 MED ORDER — CEFAZOLIN SODIUM-DEXTROSE 2-3 GM-% IV SOLR
2.0000 g | Freq: Once | INTRAVENOUS | Status: AC
  Administered 2011-12-31: 2 g via INTRAVENOUS

## 2011-12-31 MED ORDER — LIDOCAINE HCL (CARDIAC) 20 MG/ML IV SOLN
INTRAVENOUS | Status: DC | PRN
  Administered 2011-12-31: 50 mg via INTRAVENOUS

## 2011-12-31 MED ORDER — SODIUM CHLORIDE 0.9 % IV SOLN
INTRAVENOUS | Status: DC
  Administered 2011-12-31: 18:00:00 via INTRAVENOUS

## 2011-12-31 MED ORDER — ONDANSETRON HCL 4 MG/2ML IJ SOLN
INTRAMUSCULAR | Status: DC | PRN
  Administered 2011-12-31: 4 mg via INTRAVENOUS

## 2011-12-31 MED ORDER — OXYCODONE-ACETAMINOPHEN 5-325 MG PO TABS: ORAL_TABLET | ORAL | Status: DC

## 2011-12-31 MED ORDER — HYDROMORPHONE HCL PF 1 MG/ML IJ SOLN: 0.2500 mg | INTRAMUSCULAR | Status: DC | PRN

## 2011-12-31 MED ORDER — ONDANSETRON HCL 4 MG PO TABS
4.0000 mg | ORAL_TABLET | Freq: Four times a day (QID) | ORAL | Status: DC | PRN
  Administered 2012-01-01: 4 mg via ORAL

## 2011-12-31 MED ORDER — ONDANSETRON HCL 4 MG/2ML IJ SOLN: 4.0000 mg | Freq: Four times a day (QID) | INTRAMUSCULAR | Status: DC | PRN

## 2011-12-31 MED ORDER — PROMETHAZINE HCL 25 MG/ML IJ SOLN
12.5000 mg | Freq: Four times a day (QID) | INTRAMUSCULAR | Status: DC | PRN
  Administered 2011-12-31: 6.25 mg via INTRAVENOUS

## 2011-12-31 MED ORDER — GLYCOPYRROLATE 0.2 MG/ML IJ SOLN
INTRAMUSCULAR | Status: DC | PRN
  Administered 2011-12-31: 0.2 mg via INTRAVENOUS

## 2011-12-31 MED ORDER — PROPOFOL 10 MG/ML IV BOLUS
INTRAVENOUS | Status: DC | PRN
  Administered 2011-12-31 (×2): 25 mg via INTRAVENOUS
  Administered 2011-12-31: 200 mg via INTRAVENOUS

## 2011-12-31 MED ORDER — CEFAZOLIN SODIUM-DEXTROSE 2-3 GM-% IV SOLR
2.0000 g | Freq: Four times a day (QID) | INTRAVENOUS | Status: DC
  Administered 2011-12-31 – 2012-01-01 (×2): 2 g via INTRAVENOUS

## 2011-12-31 MED ORDER — OXYCODONE HCL 5 MG/5ML PO SOLN: 5.0000 mg | Freq: Once | ORAL | Status: AC | PRN

## 2011-12-31 MED ORDER — FENTANYL CITRATE 0.05 MG/ML IJ SOLN
50.0000 ug | INTRAMUSCULAR | Status: DC | PRN
  Administered 2011-12-31: 100 ug via INTRAVENOUS

## 2011-12-31 MED ORDER — IBUPROFEN 600 MG PO TABS: 600.0000 mg | ORAL_TABLET | Freq: Four times a day (QID) | ORAL | Status: DC | PRN

## 2011-12-31 MED ORDER — CHLORHEXIDINE GLUCONATE 4 % EX LIQD: 60.0000 mL | Freq: Once | CUTANEOUS | Status: DC

## 2011-12-31 MED ORDER — HYDROMORPHONE HCL PF 1 MG/ML IJ SOLN
0.5000 mg | INTRAMUSCULAR | Status: DC | PRN
  Administered 2012-01-01 (×3): 1 mg via INTRAVENOUS

## 2011-12-31 MED ORDER — METOCLOPRAMIDE HCL 5 MG PO TABS: 5.0000 mg | ORAL_TABLET | Freq: Three times a day (TID) | ORAL | Status: DC | PRN

## 2011-12-31 MED ORDER — OXYCODONE HCL 5 MG PO TABS: 5.0000 mg | ORAL_TABLET | Freq: Once | ORAL | Status: AC | PRN

## 2011-12-31 MED ORDER — CEPHALEXIN 500 MG PO CAPS: 500.0000 mg | ORAL_CAPSULE | Freq: Three times a day (TID) | ORAL | Status: DC

## 2011-12-31 MED ORDER — METOCLOPRAMIDE HCL 5 MG/ML IJ SOLN: 5.0000 mg | Freq: Three times a day (TID) | INTRAMUSCULAR | Status: DC | PRN

## 2011-12-31 MED ORDER — ROPIVACAINE HCL 5 MG/ML IJ SOLN
INTRAMUSCULAR | Status: DC | PRN
  Administered 2011-12-31: 30 mL via EPIDURAL

## 2011-12-31 MED ORDER — SODIUM CHLORIDE 0.9 % IR SOLN
Status: DC | PRN
  Administered 2011-12-31: 1

## 2011-12-31 SURGICAL SUPPLY — 95 items
BANDAGE ADHESIVE 1X3 (GAUZE/BANDAGES/DRESSINGS) IMPLANT
BANDAGE ELASTIC 3 VELCRO ST LF (GAUZE/BANDAGES/DRESSINGS) ×4 IMPLANT
BANDAGE ELASTIC 4 VELCRO ST LF (GAUZE/BANDAGES/DRESSINGS) IMPLANT
BANDAGE GAUZE ELAST BULKY 4 IN (GAUZE/BANDAGES/DRESSINGS) ×2 IMPLANT
BLADE AVERAGE 25X9 (BLADE) IMPLANT
BLADE MINI RND TIP GREEN BEAV (BLADE) ×2 IMPLANT
BLADE SURG 15 STRL LF DISP TIS (BLADE) ×1 IMPLANT
BLADE SURG 15 STRL SS (BLADE) ×1
BNDG ESMARK 4X9 LF (GAUZE/BANDAGES/DRESSINGS) ×2 IMPLANT
BRUSH SCRUB EZ PLAIN DRY (MISCELLANEOUS) ×2 IMPLANT
BUR CUDA 2.9 (BURR) IMPLANT
BUR FULL RADIUS 2.9 (BURR) ×2 IMPLANT
BUR GATOR 2.9 (BURR) IMPLANT
BUR SPHERICAL 2.9 (BURR) IMPLANT
CANISTER SUCTION 1200CC (MISCELLANEOUS) ×2 IMPLANT
CLOTH BEACON ORANGE TIMEOUT ST (SAFETY) ×2 IMPLANT
CORDS BIPOLAR (ELECTRODE) ×2 IMPLANT
COVER MAYO STAND STRL (DRAPES) ×2 IMPLANT
COVER TABLE BACK 60X90 (DRAPES) ×2 IMPLANT
CUFF TOURNIQUET SINGLE 18IN (TOURNIQUET CUFF) ×2 IMPLANT
DECANTER SPIKE VIAL GLASS SM (MISCELLANEOUS) IMPLANT
DRAPE EXTREMITY T 121X128X90 (DRAPE) ×2 IMPLANT
DRAPE OEC MINIVIEW 54X84 (DRAPES) ×2 IMPLANT
DRAPE SURG 17X23 STRL (DRAPES) ×2 IMPLANT
DRSG EMULSION OIL 3X3 NADH (GAUZE/BANDAGES/DRESSINGS) IMPLANT
GAUZE XEROFORM 1X8 LF (GAUZE/BANDAGES/DRESSINGS) IMPLANT
GLOVE BIO SURGEON STRL SZ 6.5 (GLOVE) ×4 IMPLANT
GLOVE BIOGEL M STRL SZ7.5 (GLOVE) ×2 IMPLANT
GLOVE BIOGEL PI IND STRL 7.0 (GLOVE) ×1 IMPLANT
GLOVE BIOGEL PI INDICATOR 7.0 (GLOVE) ×1
GLOVE EXAM NITRILE MD LF STRL (GLOVE) ×2 IMPLANT
GLOVE ORTHO TXT STRL SZ7.5 (GLOVE) ×2 IMPLANT
GOWN BRE IMP PREV XXLGXLNG (GOWN DISPOSABLE) ×4 IMPLANT
GOWN PREVENTION PLUS XLARGE (GOWN DISPOSABLE) ×2 IMPLANT
GOWN PREVENTION PLUS XXLARGE (GOWN DISPOSABLE) IMPLANT
IV NS 500ML (IV SOLUTION) ×1
IV NS 500ML BAXH (IV SOLUTION) ×1 IMPLANT
IV NS IRRIG 3000ML ARTHROMATIC (IV SOLUTION) ×2 IMPLANT
KWIRE 4.0 X .045IN (WIRE) ×2 IMPLANT
KWIRE 4.0 X .062IN (WIRE) ×2 IMPLANT
LOOP VESSEL MAXI BLUE (MISCELLANEOUS) IMPLANT
NDL SAFETY ECLIPSE 18X1.5 (NEEDLE) ×1 IMPLANT
NEEDLE 27GAX1X1/2 (NEEDLE) IMPLANT
NEEDLE HYPO 18GX1.5 SHARP (NEEDLE) ×1
NEEDLE HYPO 22GX1.5 SAFETY (NEEDLE) ×2 IMPLANT
NEEDLE MAYO 6 CRC TAPER PT (NEEDLE) ×2 IMPLANT
NEEDLE TUOHY 20GX3.5 (NEEDLE) ×2 IMPLANT
NS IRRIG 1000ML POUR BTL (IV SOLUTION) IMPLANT
PACK BASIN DAY SURGERY FS (CUSTOM PROCEDURE TRAY) ×2 IMPLANT
PAD CAST 3X4 CTTN HI CHSV (CAST SUPPLIES) ×2 IMPLANT
PADDING CAST ABS 4INX4YD NS (CAST SUPPLIES) ×1
PADDING CAST ABS COTTON 4X4 ST (CAST SUPPLIES) ×1 IMPLANT
PADDING CAST COTTON 3X4 STRL (CAST SUPPLIES) ×2
PASSER SUT SWANSON 36MM LOOP (INSTRUMENTS) ×2 IMPLANT
SET SM JOINT TUBING/CANN (CANNULA) ×2 IMPLANT
SLEEVE SCD COMPRESS KNEE MED (MISCELLANEOUS) ×2 IMPLANT
SLING ARM FOAM STRAP LRG (SOFTGOODS) ×2 IMPLANT
SPLINT PLASTER CAST XFAST 3X15 (CAST SUPPLIES) ×15 IMPLANT
SPLINT PLASTER XTRA FASTSET 3X (CAST SUPPLIES) ×15
SPONGE GAUZE 4X4 12PLY (GAUZE/BANDAGES/DRESSINGS) ×2 IMPLANT
STOCKINETTE 4X48 STRL (DRAPES) ×2 IMPLANT
STRIP CLOSURE SKIN 1/2X4 (GAUZE/BANDAGES/DRESSINGS) ×2 IMPLANT
SUCTION FRAZIER TIP 10 FR DISP (SUCTIONS) IMPLANT
SUT ETHIBOND 2 OS 4 DA (SUTURE) ×2 IMPLANT
SUT ETHIBOND 3-0 V-5 (SUTURE) ×2 IMPLANT
SUT ETHILON 5 0 P 3 18 (SUTURE)
SUT FIBERSTICK 2-0 (SUTURE) ×2 IMPLANT
SUT FIBERWIRE 2-0 18 17.9 3/8 (SUTURE)
SUT FIBERWIRE 3-0 18 TAPR NDL (SUTURE)
SUT LASSO 70 DEG MICRO SHORT (SUTURE) ×2
SUT LASSO MICRO MINOR BEND (SUTURE) ×2 IMPLANT
SUT MERSILENE 4 0 P 3 (SUTURE) IMPLANT
SUT NYLON ETHILON 5-0 P-3 1X18 (SUTURE) IMPLANT
SUT PROLENE 3 0 PS 2 (SUTURE) ×2 IMPLANT
SUT STEEL 0 (SUTURE)
SUT STEEL 0 18XMFL TIE 17 (SUTURE) IMPLANT
SUT STEEL 3 0 (SUTURE) IMPLANT
SUT TICRON 1 T 12 (SUTURE) ×2 IMPLANT
SUT VIC AB 0 CT1 27 (SUTURE)
SUT VIC AB 0 CT1 27XBRD ANBCTR (SUTURE) IMPLANT
SUT VIC AB 2-0 SH 27 (SUTURE)
SUT VIC AB 2-0 SH 27XBRD (SUTURE) IMPLANT
SUT VIC AB 4-0 P-3 18XBRD (SUTURE) ×1 IMPLANT
SUT VIC AB 4-0 P3 18 (SUTURE) ×1
SUT VICRYL 4-0 PS2 18IN ABS (SUTURE) IMPLANT
SUTURE FIBERWR 2-0 18 17.9 3/8 (SUTURE) IMPLANT
SUTURE FIBERWR 3-0 18 TAPR NDL (SUTURE) IMPLANT
SUTURE LASSO 70 DEG MICR SHORT (SUTURE) ×1 IMPLANT
SYR 3ML 23GX1 SAFETY (SYRINGE) IMPLANT
SYR BULB 3OZ (MISCELLANEOUS) IMPLANT
SYR CONTROL 10ML LL (SYRINGE) ×2 IMPLANT
TRAY DSU PREP LF (CUSTOM PROCEDURE TRAY) ×2 IMPLANT
TUBE CONNECTING 20X1/4 (TUBING) ×2 IMPLANT
UNDERPAD 30X30 INCONTINENT (UNDERPADS AND DIAPERS) ×2 IMPLANT
WATER STERILE IRR 1000ML POUR (IV SOLUTION) ×2 IMPLANT

## 2011-12-31 NOTE — Brief Op Note (Signed)
12/31/2011  4:00 PM  PATIENT:  [name]  36 y.o. 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, chronic ulnar styloid fracture non union  PROCEDURE:  Foveal and superficial limb of TFCC tear repair with through bone suture  SURGEON:  Surgeon(s) and Role:    * Wyn Forster., MD - Primary  PHYSICIAN ASSISTANT:   ASSISTANTS:Angelina Venard Dasnoit,P.A-C   ANESTHESIA:   general  EBL:  Total I/O In: 1500 [I.V.:1500] Out: -   BLOOD ADMINISTERED:none  DRAINS: none   LOCAL MEDICATIONS USED:  LIDOCAINE   SPECIMEN:  No Specimen  DISPOSITION OF SPECIMEN:  N/A  COUNTS:  YES  TOURNIQUET:   Total Tourniquet Time Documented: Upper Arm (Left) - 126 minutes  DICTATION: .Other Dictation: Dictation Number [id number]  PLAN OF CARE: Admit for overnight observation  PATIENT DISPOSITION:  PACU - hemodynamically stable.

## 2011-12-31 NOTE — Anesthesia Postprocedure Evaluation (Signed)
Anesthesia Post Note  Patient: [name]  Procedure(s) Performed: Procedure(s) (LRB): ARTHROSCOPY WRIST (Left)  Anesthesia type: General  Patient location: PACU  Post pain: Pain level controlled and Adequate analgesia  Post assessment: Post-op Vital signs reviewed, Patient's Cardiovascular Status Stable, Respiratory Function Stable, Patent Airway and Pain level controlled  Last Vitals:  Filed Vitals:   12/31/11 1630  BP: 110/58  Pulse: 81  Temp:   Resp: 19    Post vital signs: Reviewed and stable  Level of consciousness: awake, alert  and oriented  Complications: No apparent anesthesia complications

## 2011-12-31 NOTE — Anesthesia Procedure Notes (Addendum)
Anesthesia Regional Block:  Supraclavicular block  Pre-Anesthetic Checklist: ,, timeout performed, Correct Patient, Correct Site, Correct Laterality, Correct Procedure, Correct Position, site marked, Risks and benefits discussed,  Surgical consent,  Pre-op evaluation,  At surgeon's request and post-op pain management  Laterality: Left  Prep: chloraprep       Needles:  Injection technique: Single-shot  Needle Type: Echogenic Stimulator Needle     Needle Length: 5cm 5 cm Needle Gauge: 22 and 22 G    Additional Needles:  Procedures: ultrasound guided and nerve stimulator Supraclavicular block  Nerve Stimulator or Paresthesia:  Response: biceps flexion, 0.45 mA,   Additional Responses:   Narrative:  Start time: 12/31/2011 12:29 PM End time: 12/31/2011 12:49 PM Injection made incrementally with aspirations every 5 mL.  Performed by: Personally  Anesthesiologist: Dr Chaney Malling  Additional Notes: Functioning IV was confirmed and monitors were applied.  A 50mm 22ga Arrow echogenic stimulator needle was used. Sterile prep and drape,hand hygiene and sterile gloves were used.  Negative aspiration and negative test dose prior to incremental administration of local anesthetic. The patient tolerated the procedure well.  Ultrasound guidance: relevent anatomy identified, needle position confirmed, local anesthetic spread visualized around nerve(s), vascular puncture avoided.  Image printed for medical record.   Supraclavicular block Procedure Name: LMA Insertion Date/Time: 12/31/2011 1:33 PM Performed by: Gar Gibbon Pre-anesthesia Checklist: Patient identified, Emergency Drugs available, Suction available and Patient being monitored Patient Re-evaluated:Patient Re-evaluated prior to inductionOxygen Delivery Method: Circle System Utilized Preoxygenation: Pre-oxygenation with 100% oxygen Intubation Type: IV induction Ventilation: Mask ventilation without difficulty LMA: LMA  inserted LMA Size: 4.0 Number of attempts: 1 Airway Equipment and Method: bite block Placement Confirmation: positive ETCO2 Tube secured with: Tape Dental Injury: Teeth and Oropharynx as per pre-operative assessment

## 2011-12-31 NOTE — Op Note (Signed)
[id number] 

## 2011-12-31 NOTE — Addendum Note (Signed)
Addendum  created 12/31/11 1705 by Raiford Simmonds, MD   Modules edited:Orders

## 2011-12-31 NOTE — Progress Notes (Signed)
Assisted Dr. Hodierne with left, ultrasound guided, supraclavicular block. Side rails up, monitors on throughout procedure. See vital signs in flow sheet. Tolerated Procedure well. 

## 2011-12-31 NOTE — Anesthesia Preprocedure Evaluation (Signed)
Anesthesia Evaluation  Patient identified by MRN, date of birth, ID band Patient awake    Reviewed: Allergy & Precautions, H&P , NPO status , Patient's Chart, lab work & pertinent test results  History of Anesthesia Complications (+) PONV  Airway Mallampati: II  Neck ROM: full    Dental   Pulmonary          Cardiovascular     Neuro/Psych  Headaches,    GI/Hepatic GERD-  ,  Endo/Other  Hypothyroidism   Renal/GU      Musculoskeletal   Abdominal   Peds  Hematology   Anesthesia Other Findings   Reproductive/Obstetrics                           Anesthesia Physical Anesthesia Plan  ASA: II  Anesthesia Plan: General and Regional   Post-op Pain Management: MAC Combined w/ Regional for Post-op pain   Induction: Intravenous  Airway Management Planned: LMA  Additional Equipment:   Intra-op Plan:   Post-operative Plan:   Informed Consent: I have reviewed the patients History and Physical, chart, labs and discussed the procedure including the risks, benefits and alternatives for the proposed anesthesia with the patient or authorized representative who has indicated his/her understanding and acceptance.     Plan Discussed with: CRNA and Surgeon  Anesthesia Plan Comments:         Anesthesia Quick Evaluation

## 2011-12-31 NOTE — H&P (Addendum)
[name] is an 36 y.o. female.   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  She presents for evaluation of a chronically painful, swollen left wrist with a prominent ulna.  Her symptoms began after an episode of transporting a trauma patient to the hospital on 08/19/11.  She noted swelling near the ulnar head and iced her wrist.  She reported the injury to her supervisor.  She was then sent to urgent care and was noted to have on x-ray an ununited ulnar styloid fracture.  She was felt to have a possible reinjury of a prior ulnar injury.  She was referred to see Dr. Bradly Bienenstock at [facility] on 08/23/11.  Dr. Melvyn Novas examined her wrist and identified what appeared to be an acute-on-chronic injury to the region of the ulnar styloid. He recommended closed treatment of the ulnar styloid fracture at that time and placed her on light duty.  She had persistent pain despite splinting for a week followed by a short arm cast for three weeks.  She was sent for a MRI on 10/07/11 obtained at [facility].  This was interpreted by the radiologist at [facility], however, the physician's name who read the films is not on the report that we have available for review.  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 There was fraying of the dorsoradial ulnar ligament and articular disc of the TFC, the superficial portion of the TFC is attached to the chronic ulnar styloid nonunion fragment.  She was noted to have a positive ulnar variance and a tear of the extensor carpi ulnaris subsheath.     Past Medical History  Diagnosis Date  . Headache     migraines  . Postural orthostatic tachycardia syndrome   . Chronic back pain greater  than 3 months duration   . GERD (gastroesophageal reflux disease)   . Hypothyroidism   . Cough 12/24/2011  . Astigmatism     mild  . High cholesterol   . Snores   . Triangular fibrocartilage complex injury 12/2011    foveal detachment - left    Past Surgical History  Procedure Date  . Cardiac catheterization 2008  . Iud removal 05/01/2009  . Intrauterine device insertion 05/01/2009  . Dilation and curettage of uterus 05/01/2009  . Cesarean section 05/03/2003; 02/06/2006    History reviewed. No pertinent family history. Social History:  reports that she has been smoking Cigarettes.  She has a 11 pack-year smoking history. She has never used smokeless tobacco. She reports that she drinks alcohol. She reports that she uses illicit drugs (Methamphetamines).  Allergies:  Allergies  Allergen Reactions  . Decongest-Aid (Pseudoephedrine) Other (See Comments)    TACHYCARDIA  . Prednisone Other (See Comments)    TACHYCARDIA  . Versed (Midazolam) Other (See Comments)    SEVERE ANGER REACTION    No prescriptions prior to admission    No results found for this or any previous visit (from the past 48 hour(s)).  No results found.   Pertinent items are noted in HPI.  Height 5' 2.5" (1.588 m), weight 65.318 kg (144 lb), last menstrual period 12/23/2011.  General appearance: alert Head: Normocephalic, without obvious abnormality Neck: supple, symmetrical, trachea midline Resp: clear to auscultation bilaterally  Cardio: regular rate and rhythm GI: normal findings: bowel sounds normal Extremities:  Inspection of her wrist reveals prominence of the left ulna.  She has significant tenderness on palpation of the fovea and pain at the limits of pronation/supination.  She had marked pain with ulnocarpal translation with her wrist in an ulnar deviated posture.  She had pain with stress of the distal radial ulnar joint primarily with her wrist in pronation or neutral, but less pain in supination.   This would suggest that her pronator quadratus is stabilizing her distal radial ulnar joint to a degree.  She had a negative Watson's maneuver, no pain on palpation to the hook of the hamate and no pain on pisotriquetral grind.    X-rays from [facility] dated 09/16/11 are reviewed.  Lindley is noted to have wide diastasis of the distal radial ulnar joint and an ununited styloid fracture fragment.  She is ulnar plus by approximately 2 mm.  in this view, however, this is not a dedicated neutral view, but rather a pronated view.     Neutral PA and lateral films of her right and left wrist.  She is noted to have an ulnar positive variant of approximately 1  mm.  on the left in and an ununited chronic styloid nonunion fragment.    On her lateral film and also the film from Dr. Glenna Durand office there is the appearance of some palmar positioning of the distal ulnar relative to the sigmoid notch.    Her right and left hand films are not exactly identical on the lateral view obtained at our office.   MRI films: The left wrist shows a marked positive ulnar variance without evidence of ulnocarpal abutment findings in the triquetrum or lunate.  There is abnormal signal in the peripheral triangular fibrocartilage and attachment of the superficial fibers of the triangular fibrocartilage to the ulnar styloid nonunion fracture fragment and edema at the level of the ulnar subsheath suggesting an acute-on-chronic injury to the subsheath.  There is abnormal signal at the fovea suggesting that a portion of her foveal attachment of the triangular fibrocartilage is injured on the palmar cuts through the fovea.   Pulses: 2+ and symmetric Skin: normal Neurologic: Grossly normal    Assessment/Plan  Impression:Left wrist ulnar positive variance, triangular fibrocartilage instability and distal radioulnar instability.     Plan: To the OR for left wrist scope with possible TFCC repair and ulna shortening.The  procedure, risks,benefits and post-op course were discussed with the patient at length and they were in agreement with the plan.  KaraEvany Schecter Acosta 12/31/2011, 11:16 AM    H&P documentation: 12/31/2011  -History and Physical Reviewed  -Patient has been re-examined  -No change in the plan of care  Wyn Forster, MD

## 2011-12-31 NOTE — Transfer of Care (Signed)
Immediate Anesthesia Transfer of Care Note  Patient: [name]  Procedure(s) Performed: Procedure(s) (LRB) with comments: ARTHROSCOPY WRIST (Left) - Open triangular fibrocartilage complex repair and ulnar shortening left forearm   Patient Location: PACU  Anesthesia Type: GA combined with regional for post-op pain  Level of Consciousness: sedated and patient cooperative  Airway & Oxygen Therapy: Patient Spontanous Breathing and Patient connected to face mask oxygen  Post-op Assessment: Report given to PACU RN and Post -op Vital signs reviewed and stable  Post vital signs: Reviewed and stable  Complications: No apparent anesthesia complications

## 2012-01-01 LAB — POCT HEMOGLOBIN-HEMACUE: Hemoglobin: 13.1 g/dL (ref 12.0–15.0)

## 2012-01-01 MED ORDER — DIPHENHYDRAMINE HCL 25 MG PO CAPS
25.0000 mg | ORAL_CAPSULE | Freq: Four times a day (QID) | ORAL | Status: DC | PRN
  Administered 2012-01-01: 25 mg via ORAL

## 2012-01-01 NOTE — Op Note (Signed)
dictated

## 2012-01-01 NOTE — Op Note (Signed)
[name], [name] NO.:  [id number]  MEDICAL RECORD NO.:  [id number]  LOCATION:                                 FACILITY:  PHYSICIAN:  [name]. [name], M.D. DATE OF BIRTH:  1975-06-06  DATE OF PROCEDURE:  12/31/2011 DATE OF DISCHARGE:  01/01/2012                              OPERATIVE REPORT   PREOPERATIVE DIAGNOSIS:  Clinical and MRI documented superficial and partial foveal detachment of triangular fibrocartilage complex with chronic ulnar positive variant, without evidence of ulnocarpal abutment.  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  OPERATION: 1. Examination of left wrist and forearm/distal radioulnar joint     revealing instability of triangular fibrocartilage and subluxing     distal radioulnar joint. 2. Arthroscopically-assisted reconstruction of foveal and superficial     limbs of triangular fibrocartilage. 3. Partial resection of ulnar styloid nonunion and partial resection     of ulnar styloid fragment nonunion with identification of inability     to nest the fragment to the styloid due to chronicity of nonunion     and shortening of fibers of superficial band of triangular     fibrocartilage.  The triangular fibrocartilage was repaired as two     mattress suture construct; one through the foveal attachment and     the second through the superficial attachment through drill hole     through fovea, tied over distal ulna.  OPERATING SURGEON:  [name]. Kara Wentzell, MD  ASSISTANT:  Kara Reeks Dasnoit, PA-C  ANESTHESIA:  General by LMA supplemented by a left plexus block.  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  She had a history of a prior unrecognized ulnar styloid nonunion from a possible childhood injury.  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  She was initially evaluated by [name] of [facility], and was noted to have what appeared to be a possible acute styloid injury with the past nonunion.  She was treated with closed technique without resolution of her symptoms.  She sought a second opinion and was evaluated at the office and noted to have an unstable distal radioulnar joint and marked foveal tenderness. Careful examination of her MRI revealed that she was ulnar positive without evidence of ulnocarpal abutment.  She had a floating ulnar styloid fragment with the superficial fibers of the triangular fibrocartilage attached and a partial foveal detachment.  We explained her complex injury in detail.  We stated that we would examine her risk carefully and looked for signs of chronic ulnocarpal abutment.  We recommended an attempt at foveal and superficial triangle fibrocartilage repair with or without ulnar shortening.  The decision to perform ulnar shortening was to be established with direct inspection of the lunate, ulnar head, and triangle fibrocartilage.  Preoperatively, she was reminded the  potential risks and benefits of surgery.  We had detailed informed consent with her family including her mother, father, grandfather and stepmother.  Questions were invited and answered in detail.  PROCEDURE:  [name] was interviewed in the holding area by Dr. Chaney Acosta and after detailed informed consent, had a left plexus block placed without complication.  She was subsequently transferred to room #2 where under Dr. Seward Acosta direct supervision, general anesthesia by LMA technique was induced.  Her left hand and arm were prepped with Betadine soap  and solution, and sterilely draped.  A 2 g of Ancef were administered as an IV prophylactic antibiotic.  The procedure commenced with exsanguination of left arm with Esmarch bandage and inflation of arterial tourniquet on the left arm to 220 mmHg.  Her index and long fingers were placed in finger traps.  Her forearm was placed in a tower designed for wrist arthroscopy with counter traction on the forearm and distraction of 10 pounds applied to the wrist.  Her wrist was sounded with an 18-gauge needle and a 0.034 port was created with blunt technique.  Diagnostic arthroscopy revealed that her radial attachment of the triangular fibrocartilage was intact.  She had normal- appearing hyaline cartilage under lunate, scaphoid and triquetrum. There was no sign of intercarpal ligament disruption.  She had a positive rub sign evidencing a unstable superficial triangular fibrocartilage as well as a partial foveal detachment.  A nerve hook and a blunt probe brought in through 4-5 dorsal portal allowed [place] to completely elevate the triangular fibrocartilage off of the styloid region.  We palpated the nonunion fragment.  It became obvious that it would be very challenging to nest the nonunion fragment back to an anatomic position given the chronicity of this predicament.  We then created photographic documentation of the pathology and removed the arthroscope.  A longitudinal incision was fashioned along the subcutaneous border of the ulna crossing, the ulnar styloid region. Care was taken to identify the dorsal ulnar sensory branch, which was marked and gently retracted throughout the procedure.  We took down the capsule of the distal radioulnar joint in the region of the triangular fibrocartilage and styloid.  The styloid fragment was palpated and the distal stump of the true ulnar styloid was freshened with a rongeur.  We attempted to capture the floating fragment in the  triangular fibrocartilage and capsule, however, this was rather challenging and after working within the joint and outside of the joint, trying to reduce it.  We identified that the triangular fibrocartilage had contracted rendering this an impossible task.  We then elected to perform a Lasso-type procedure.  The foveal portion of the triangular fibrocartilage was repaired with the aid of a Tuohy needle with outside in technique, shuttling suture with a suture shuttle wire.  The superficial portion was repaired with an outside in technique utilizing a 70-degree microsuture Lasso with a FiberStick suture.  The sutures were then brought through a drill hole created through the fovea from the shaft of the ulna proximally.  The sutures were then shuttled through the drill hole with a Swanson suture passer and after creating a tissue bridge to tie suture over, the distal radioulnar joint was reduced and the suture was tied with proper tension.  The floating styloid fragment was left in place so as to not disrupt more of the triangular fibrocartilage.  We were able to achieve excellent tension in the repair.  One technical complication that we did experience was a partial cut into  the foveal repair due to the abrasive quality of the Tycron suture utilized.  We still had good purchase around the fragment and did not appear to have any difficulties with the integrity of this repair.  The more superficial stitch we documented with the arthroscope to be in good position.  The wounds were then thoroughly irrigated with sterile saline.  We did enlarge the 4-5 portal so that we could directly inspect the suture pathways so as to avoid any possible fouling of the extensor digiti minimi tendon.  The construct was completed followed by anatomic repair of the capsule, fascia and skin with 3-0 Ethibond, 4-0 Vicryl and 3-0 Prolene.  The portals were repaired with subcutaneous 4-0 Vicryl and  intradermal 3- 0 Prolene.  There were no apparent complications.  We used a C-arm fluoroscope to document that the fragment was still floating free.  We did have a very stable construct, however.  We will contemplate overnight admission to [facility] due to the fact that it is late in the day and that this may be a moderately painful postoperative course.     [name], M.D.     RVS/MEDQ  D:  12/31/2011  T:  01/01/2012  Job:  [id number]

## 2012-01-02 ENCOUNTER — Encounter (HOSPITAL_BASED_OUTPATIENT_CLINIC_OR_DEPARTMENT_OTHER): Payer: Self-pay | Admitting: Orthopedic Surgery

## 2013-03-28 ENCOUNTER — Other Ambulatory Visit: Payer: Self-pay | Admitting: Internal Medicine

## 2013-03-28 ENCOUNTER — Ambulatory Visit: Payer: 59

## 2013-03-28 ENCOUNTER — Ambulatory Visit (INDEPENDENT_AMBULATORY_CARE_PROVIDER_SITE_OTHER): Payer: 59 | Admitting: Internal Medicine

## 2013-03-28 VITALS — BP 118/66 | HR 80 | Temp 98.6°F | Resp 16 | Ht 66.5 in | Wt 173.0 lb

## 2013-03-28 DIAGNOSIS — R635 Abnormal weight gain: Secondary | ICD-10-CM

## 2013-03-28 DIAGNOSIS — Z6827 Body mass index (BMI) 27.0-27.9, adult: Secondary | ICD-10-CM | POA: Insufficient documentation

## 2013-03-28 DIAGNOSIS — M25579 Pain in unspecified ankle and joints of unspecified foot: Secondary | ICD-10-CM

## 2013-03-28 DIAGNOSIS — R5383 Other fatigue: Secondary | ICD-10-CM

## 2013-03-28 DIAGNOSIS — M25571 Pain in right ankle and joints of right foot: Secondary | ICD-10-CM

## 2013-03-28 DIAGNOSIS — G8929 Other chronic pain: Secondary | ICD-10-CM | POA: Insufficient documentation

## 2013-03-28 DIAGNOSIS — E785 Hyperlipidemia, unspecified: Secondary | ICD-10-CM | POA: Insufficient documentation

## 2013-03-28 DIAGNOSIS — R5381 Other malaise: Secondary | ICD-10-CM

## 2013-03-28 DIAGNOSIS — R1011 Right upper quadrant pain: Secondary | ICD-10-CM

## 2013-03-28 DIAGNOSIS — G4700 Insomnia, unspecified: Secondary | ICD-10-CM

## 2013-03-28 DIAGNOSIS — R51 Headache: Secondary | ICD-10-CM

## 2013-03-28 DIAGNOSIS — E039 Hypothyroidism, unspecified: Secondary | ICD-10-CM | POA: Insufficient documentation

## 2013-03-28 DIAGNOSIS — K219 Gastro-esophageal reflux disease without esophagitis: Secondary | ICD-10-CM

## 2013-03-28 DIAGNOSIS — R519 Headache, unspecified: Secondary | ICD-10-CM

## 2013-03-28 DIAGNOSIS — F17200 Nicotine dependence, unspecified, uncomplicated: Secondary | ICD-10-CM

## 2013-03-28 DIAGNOSIS — M25532 Pain in left wrist: Secondary | ICD-10-CM

## 2013-03-28 DIAGNOSIS — M79661 Pain in right lower leg: Secondary | ICD-10-CM

## 2013-03-28 LAB — POCT CBC
GRANULOCYTE PERCENT: 65.1 % (ref 37–80)
HCT, POC: 43.2 % (ref 37.7–47.9)
Hemoglobin: 13.8 g/dL (ref 12.2–16.2)
LYMPH, POC: 2.3 (ref 0.6–3.4)
MCH, POC: 30.9 pg (ref 27–31.2)
MCHC: 31.9 g/dL (ref 31.8–35.4)
MCV: 96.7 fL (ref 80–97)
MID (cbc): 0.6 (ref 0–0.9)
MPV: 8.8 fL (ref 0–99.8)
PLATELET COUNT, POC: 230 10*3/uL (ref 142–424)
POC GRANULOCYTE: 5.3 (ref 2–6.9)
POC LYMPH %: 27.8 % (ref 10–50)
POC MID %: 7.1 % (ref 0–12)
RBC: 4.47 M/uL (ref 4.04–5.48)
RDW, POC: 13 %
WBC: 8.2 10*3/uL (ref 4.6–10.2)

## 2013-03-28 LAB — POCT URINE PREGNANCY: Preg Test, Ur: NEGATIVE

## 2013-03-28 LAB — POCT SEDIMENTATION RATE: POCT SED RATE: 11 mm/hr (ref 0–22)

## 2013-03-28 MED ORDER — OMEPRAZOLE 40 MG PO CPDR: 40.0000 mg | DELAYED_RELEASE_CAPSULE | Freq: Every day | ORAL | Status: DC

## 2013-03-28 MED ORDER — MELOXICAM 15 MG PO TABS: 15.0000 mg | ORAL_TABLET | Freq: Every day | ORAL | Status: DC

## 2013-03-28 NOTE — Progress Notes (Addendum)
Subjective:    Patient ID: [name], female    DOB: 23-Apr-1975, 38 y.o.   MRN: [id number]  HPI This chart was scribed for [facility], by Ladona Ridgel Day, Scribe. This patient was seen in room 1 and the patient's care was started at 4:08 PM.  HPI Comments: [name] is a 38 y.o. female who presents to the [facility] complaining of 1). right ankle pain and 2). Fatigue.  1.) She reports right medial ankle pain for 3-4 weeks, constant, gradually worsened, no trauma, no new activities that she can think of that might have caused this pain. She reports worsened in pain over past few days and now radiates w/pain to both sides of her right lower leg. Pain with ambulation. No redness or swelling.  2.) Fatigue, she states over the past several weeks or so that she has been more fatigued than normal. She reports hx of hypothyroidism for which she takes synthroid. TSH normal 3 or 4 months ago. Her fatigue seems progressive as well. She denies weight loss and in fact has gained40 pounds this year.   3) She reports a hx of GERD which has been worse lately but does not wake her up in the middle of the night b/c she usually takes zantac and tums before she goes to sleep. She reports that she has gained about 40 lbs. Over the past year. She denies any fever, night sweats or appetite changes.   4) She also reports lately that she sometimes has abdominal pain ruq all day and thinks that it might be her Gallbladder. Not associated with nausea vomiting constipation or diarrhea  5) Lately has had normal menstrual cycles. She used to have an IUD in place, but had bad migraines and tried [facility] but quit that as well b/c fear of blood clots. She is a smoker and is trying to quit (she smokes about 5 cigarettes per day). She has quit before but relapsed. She quit with the aid of  Chantix but had side effects to this medication  6) Using withdrawal as contraception  There are no active  problems to display for this patient-her medications and history suggests she has hypo-thyroidism, hyperlipidemia, chronic headaches, nonspecific tachycardia, reflux, and insomnia. Current outpatient prescriptions:atorvastatin (LIPITOR) 10 MG tablet, Take 10 mg by mouth daily.,  carvedilol (COREG) 12.5 MG tablet, Take 12.5 mg by mouth 2 (two) times daily with a meal., HYDROcodone-acetaminophen (NORCO/VICODIN) 5-325 MG per tablet, Take 1 tablet by mouth every 6 (six) hours as needed.  levothyroxine (SYNTHROID, LEVOTHROID) 75 MCG tablet, Take 75 mcg by mouth daily., Disp: , Rfl:  ranitidine (ZANTAC) 150 MG tablet, Take 150 mg by mouth 2 (two) times daily.,  zolpidem (AMBIEN) 10 MG tablet, Take 10 mg by mouth at bedtime as needed topiramate (TOPAMAX) 100 MG tablet, Take 150 mg by mouth daily., Disp: , Rfl:    Past Surgical History  Procedure Laterality Date  . Cardiac catheterization  2008  . Iud removal  05/01/2009  . Intrauterine device insertion  05/01/2009  . Dilation and curettage of uterus  05/01/2009  . Cesarean section  05/03/2003; 02/06/2006  . Wrist arthroscopy  12/31/2011    Procedure: ARTHROSCOPY WRIST;  Surgeon: Wyn Forster., MD;  Location: [facility];  Service: Orthopedics;  Laterality: Left;  Open triangular fibrocartilage complex repair   she was formally a paramedic and [facility] surgery 15 months ago has her still out of work-[facility] need another repair  History reviewed. No pertinent family history.  History   Social History  . Marital Status: Legally Separated    Spouse Name: N/A    Number of Children: N/A  . Years of Education: N/A   Occupational History  . Not on file.   Social History Main Topics  . Smoking status: Current Every Day Smoker -- 0.50 packs/day for 22 years    Types: Cigarettes  . Smokeless tobacco: Never Used  . Alcohol Use: Yes     Comment: rarely  . Drug Use: Yes    Special:  Methamphetamines  . Sexual Activity:    Other Topics Concern  . Not on file   Social History Narrative  . No narrative on file    Allergies  Allergen Reactions  . Decongest-Aid [Pseudoephedrine] Other (See Comments)    TACHYCARDIA  . Prednisone Other (See Comments)    TACHYCARDIA  . Versed [Midazolam] Other (See Comments)    SEVERE ANGER REACTION    Review of Systems  Constitutional: Positive for fatigue and unexpected weight change. Negative for fever, chills, activity change and appetite change.  HENT: Negative for congestion and rhinorrhea.   Eyes: Negative for photophobia and visual disturbance.  Respiratory: Negative for cough and shortness of breath.   Cardiovascular: Negative for chest pain, palpitations and leg swelling.       Tachycardia has been well controlled. The etiology was never established  Gastrointestinal: Positive for abdominal pain (intermittent RUQ abdominal pain). Negative for nausea, vomiting and diarrhea.  Endocrine: Negative for cold intolerance and heat intolerance.  Genitourinary: Negative for difficulty urinating and menstrual problem.  Musculoskeletal: Negative for back pain.  Skin: Negative for color change.  Neurological: Positive for headaches. Negative for speech difficulty and weakness.       Chronic headaches and recently changed from one medication to another/on Topamax now  Hematological: Negative for adenopathy. Does not bruise/bleed easily.  Psychiatric/Behavioral:       History of insomnia requiring medication now stable       Objective:   Physical Exam  Nursing note and vitals reviewed. Constitutional: She is oriented to person, place, and time. She appears well-developed and well-nourished. No distress.  HENT:  Head: Normocephalic and atraumatic.  Eyes: Conjunctivae and EOM are normal. Pupils are equal, round, and reactive to light. Right eye exhibits no discharge. Left eye exhibits no discharge.  Neck: Normal range of motion.  No thyromegaly present.  Cardiovascular: Normal rate, regular rhythm, normal heart sounds and intact distal pulses.   No murmur heard. Pulmonary/Chest: Effort normal and breath sounds normal. No respiratory distress.  Abdominal: Soft. She exhibits no distension and no mass. There is no tenderness. There is no rebound and no guarding.  Musculoskeletal: Normal range of motion. She exhibits no edema.  She is exquisitely tender along the medial aspect of the tibia just above the ankle/there is no effusion or skin change. The ankle has good range of motion without pain The rest of the tibia appears to be normal The right knee is also normal  Lymphadenopathy:    She has no cervical adenopathy.  Neurological: She is alert and oriented to person, place, and time. She has normal reflexes. No cranial nerve deficit.  Skin: Skin is warm and dry.  Psychiatric: She has a normal mood and affect. Her behavior is normal. Judgment and thought content normal.    Triage Vitals: BP 118/66  Pulse 80  Temp(Src) 98.6 F (37 C) (Oral)  Resp 16  Ht 5' 6.5" (1.689 m)  Wt 173 lb (78.472 kg)  BMI 27.51 kg/m2  SpO2 98%  LMP 03/11/2013  DIAGNOSTIC STUDIES: Oxygen Saturation is 98% on room air, normal by my interpretation.    COORDINATION OF CARE: At 25 PM Discussed treatment plan with patient which includes cessation discussion for her smoking, blood work. Patient agrees.   [facility] reading (PRIMARY) by  Dr. Merla Riches area of maximal tenderness over the distal medial tibia has no bony change is noted  Results for orders placed in visit on 03/28/13  POCT SEDIMENTATION RATE      Result Value Range   POCT SED RATE 11  0 - 22 mm/hr  POCT CBC      Result Value Range   WBC 8.2  4.6 - 10.2 K/uL   Lymph, poc 2.3  0.6 - 3.4   POC LYMPH PERCENT 27.8  10 - 50 %L   MID (cbc) 0.6  0 - 0.9   POC MID % 7.1  0 - 12 %M   POC Granulocyte 5.3  2 - 6.9   Granulocyte percent 65.1  37 - 80 %G   RBC 4.47  4.04 - 5.48 M/uL     Hemoglobin 13.8  12.2 - 16.2 g/dL   HCT, POC 60.4  54.0 - 47.9 %   MCV 96.7  80 - 97 fL   MCH, POC 30.9  27 - 31.2 pg   MCHC 31.9  31.8 - 35.4 g/dL   RDW, POC 98.1     Platelet Count, POC 230  142 - 424 K/uL   MPV 8.8  0 - 99.8 fL  POCT URINE PREGNANCY      Result Value Range   Preg Test, Ur Negative        45 minute office visit Assessment & Plan:  I have completed the patient encounter in its entirety as documented by the scribe, with editing by me where necessary. Robert P. Merla Riches, M.D.  Pain in joint, ankle and foot, right - this history and exam is worrisome for a destructive lesion along the tibia and we will progress to MRscan while she has a trial of meloxicam  GERD (gastroesophageal reflux disease) - H. pylori IgM//start omeprazole  Other malaise and fatigue -recheck thyroid  Weight gain -recheck thyroid  Smoker -discussed gradual cessation  Abdominal pain, RUQ - metabolic profile  Unspecified hypothyroidism - Plan: TSH, T4, free  Chronic headaches-she will hold off ibuprofen for the time being  Inadequate contraception-today's pregnancy test negative/she needs barrier contraception until something is arranged for her/estrogen seemed to worsen her headaches  Meds ordered this encounter  Medications  . meloxicam (MOBIC) 15 MG tablet    Sig: Take 1 tablet (15 mg total) by mouth daily.    Dispense:  30 tablet    Refill:  0  . omeprazole (PRILOSEC) 40 MG capsule    Sig: Take 1 capsule (40 mg total) by mouth daily.    Dispense:  30 capsule    Refill:  3

## 2013-03-29 LAB — COMPREHENSIVE METABOLIC PANEL
ALK PHOS: 33 U/L — AB (ref 39–117)
ALT: 33 U/L (ref 0–35)
AST: 24 U/L (ref 0–37)
Albumin: 4.3 g/dL (ref 3.5–5.2)
BILIRUBIN TOTAL: 0.3 mg/dL (ref 0.3–1.2)
BUN: 9 mg/dL (ref 6–23)
CO2: 26 mEq/L (ref 19–32)
Calcium: 9.6 mg/dL (ref 8.4–10.5)
Chloride: 104 mEq/L (ref 96–112)
Creat: 0.91 mg/dL (ref 0.50–1.10)
GLUCOSE: 100 mg/dL — AB (ref 70–99)
POTASSIUM: 4.3 meq/L (ref 3.5–5.3)
SODIUM: 138 meq/L (ref 135–145)
TOTAL PROTEIN: 6.7 g/dL (ref 6.0–8.3)

## 2013-03-29 LAB — TSH: TSH: 3.101 u[IU]/mL (ref 0.350–4.500)

## 2013-03-29 LAB — T4, FREE: FREE T4: 1.15 ng/dL (ref 0.80–1.80)

## 2013-03-30 ENCOUNTER — Encounter: Payer: Self-pay | Admitting: Internal Medicine

## 2013-03-30 LAB — HELICOBACTER PYLORI  ANTIBODY, IGM: Helicobacter pylori, IgM: 1.5 U/mL (ref <9.0)

## 2013-03-31 ENCOUNTER — Other Ambulatory Visit: Payer: Self-pay | Admitting: Obstetrics and Gynecology

## 2013-04-01 ENCOUNTER — Telehealth: Payer: Self-pay

## 2013-04-01 NOTE — Telephone Encounter (Signed)
Patient was seen by Dr Merla Richesoolittle for her ankle and referred for MRI. When [facility], they have an order for MRI of her shin and not her ankle. Patient did not schedule yet until she gets clarification on whether or not she needs her shin or ankle MRI. Cb# [phone].

## 2013-04-01 NOTE — Telephone Encounter (Signed)
The problems she has is pain and swelling at the medial aspect of the distal tibia not involving the ankle joint We need to be sure there is not a sarcoma, chondroma, or stress fracture of the distal tibia

## 2013-04-01 NOTE — Telephone Encounter (Signed)
Do you want ankle or tib/fib?

## 2013-04-01 NOTE — Telephone Encounter (Signed)
Thank you I have called [facility] to advise. I have also advised patient.

## 2013-04-15 ENCOUNTER — Telehealth: Payer: Self-pay

## 2013-04-15 NOTE — Telephone Encounter (Signed)
Peer to peer needed for MRI lower extrem w/o contrast. Case # [id number], ID # [id number]. Phone # [phone] option 3. Needed by 04/17/13.

## 2013-04-16 ENCOUNTER — Ambulatory Visit
Admission: RE | Admit: 2013-04-16 | Discharge: 2013-04-16 | Disposition: A | Discharge status: Home / Self Care | Payer: 59 | Source: Ambulatory Visit | Attending: Internal Medicine | Admitting: Internal Medicine

## 2013-04-16 DIAGNOSIS — M79661 Pain in right lower leg: Secondary | ICD-10-CM

## 2013-04-16 NOTE — Telephone Encounter (Signed)
[id number] expires 05/31/2013

## 2013-04-16 NOTE — Telephone Encounter (Signed)
Called [name] and gave her auth # and exp.

## 2013-04-20 ENCOUNTER — Telehealth: Payer: Self-pay | Admitting: Internal Medicine

## 2013-04-20 NOTE — Telephone Encounter (Signed)
Cancer with my phone call and mailbox. Please continue to try to contact her   Diagnosis-medial stress syndrome of tibia seen on mri CLINICAL DATA: Distal right lower leg pain for 3 months.  EXAM:  MRI OF LOWER RIGHT EXTREMITY WITHOUT CONTRAST  TECHNIQUE:  Multiplanar, multisequence MR imaging was performed. No intravenous  contrast was administered.  COMPARISON: Radiographs dated 03/28/2013  FINDINGS:  There is focal edema in the subcutaneous soft tissues at the  anterior medial aspect of the distal tibia extending from 10 cm  above the ankle joint to approximately the level of the ankle joint.  There is no edema in the underlying tibia.  This findings are consistent with medial tibial stress syndrome.  The fibula is normal. The muscles of the right lower leg appear  normal. No ankle or knee joint effusions.  IMPRESSION:  Medial tibial stress syndrome of the right lower leg.  Electronically Signed I need to see her to start on treatment plan to prevent progression to stress fracture--here next sat,sun/mon or can workin to 104 on Friday afternoon

## 2013-04-21 ENCOUNTER — Telehealth: Payer: Self-pay

## 2013-04-21 NOTE — Telephone Encounter (Signed)
LM for rtn call. 

## 2013-04-21 NOTE — Telephone Encounter (Signed)
Patient called regarding her MRI results. States she had a call from [name]. Please return her call. Thank you.

## 2013-04-21 NOTE — Telephone Encounter (Signed)
[phone] try pt at this number.

## 2013-04-21 NOTE — Telephone Encounter (Signed)
Phone message already in [facility].

## 2013-04-26 NOTE — Telephone Encounter (Signed)
Pt notified of results and will try and come in on Fri 2/6

## 2013-06-16 ENCOUNTER — Ambulatory Visit (INDEPENDENT_AMBULATORY_CARE_PROVIDER_SITE_OTHER): Payer: 59 | Admitting: Family Medicine

## 2013-06-16 VITALS — BP 110/70 | HR 84 | Temp 98.0°F | Resp 16 | Ht 63.0 in | Wt 167.0 lb

## 2013-06-16 DIAGNOSIS — J029 Acute pharyngitis, unspecified: Secondary | ICD-10-CM

## 2013-06-16 LAB — POCT RAPID STREP A (OFFICE): RAPID STREP A SCREEN: NEGATIVE

## 2013-06-16 MED ORDER — MAGIC MOUTHWASH W/LIDOCAINE: 10.0000 mL | ORAL | Status: DC | PRN

## 2013-06-16 NOTE — Progress Notes (Signed)
Subjective: 38 year old lady who is on disability because of a wrist injury. She has had 2 days of a sore throat and feeling lousy. She aches. She says her husband been sick the day prior to her. She has not been swelling. No ear pain. She is sniffling but does not complain of a lot of head congestion or cough. Minimal nausea. Last menstrual cycle 2 weeks ago. Smokes approximately 4 cigarettes a day  Objective: Somewhat ill-appearing lady. Her TMs are normal. Throat erythematous and edematous without exudate. Neck supple with tenderness in the nodes especially on the left submandibular area. Her chest is clear to auscultation. Heart regular without murmurs.  Assessment: Pharyngitis, suspicious for strep  Plan: Strep screen Throat culture if needed  Results for orders placed in visit on 06/16/13  POCT RAPID STREP A (OFFICE)      Result Value Ref Range   Rapid Strep A Screen Negative  Negative   Viral syndrome

## 2013-06-16 NOTE — Patient Instructions (Signed)
Take Tylenol or ibuprofen for fever or pain  Drink plenty of fluids  Magic mouthwash  Return if not improving

## 2013-06-18 LAB — CULTURE, GROUP A STREP: ORGANISM ID, BACTERIA: NORMAL

## 2013-06-21 ENCOUNTER — Encounter: Payer: Self-pay | Admitting: Internal Medicine

## 2013-06-22 ENCOUNTER — Telehealth: Payer: Self-pay

## 2013-06-22 MED ORDER — BENZONATATE 100 MG PO CAPS: 100.0000 mg | ORAL_CAPSULE | Freq: Three times a day (TID) | ORAL | Status: DC | PRN

## 2013-06-22 NOTE — Telephone Encounter (Signed)
Patient was seen last week.   She is NOT any better.  Would like to know next step for treatment.    Please call [phone]

## 2013-06-22 NOTE — Telephone Encounter (Signed)
Pt would like a call back at [phone]

## 2013-06-22 NOTE — Telephone Encounter (Signed)
Meds ordered this encounter  Medications  . benzonatate (TESSALON) 100 MG capsule    Sig: Take 1-2 capsules (100-200 mg total) by mouth 3 (three) times daily as needed for cough.    Dispense:  40 capsule    Refill:  0    Order Specific Question:  Supervising Provider    Answer:  DOOLITTLE, ROBERT P [[id number]]    Sent Rx for cough. TC was negative (I think she's already been notified). If symptoms persist, RTC as advised.

## 2013-06-22 NOTE — Telephone Encounter (Signed)
Spoke to pt, she is aware 

## 2013-06-22 NOTE — Telephone Encounter (Signed)
Per OV- pt to RTC if sx do not improve.

## 2013-06-22 NOTE — Telephone Encounter (Signed)
FYI. Pt states her sx have changed she now has a cough and is unable to get rest due to her cough. Her throat is hurts all the time. She has a lot of chest congestion. She states she just does not have time to come in today. (her cough on the phone is very deep and dry sounding) Strongly advised her to come in to be evaluated. Pt declines today due to her schedule but will try to come in sometime this week.

## 2013-06-22 NOTE — Telephone Encounter (Signed)
Left message to return call 

## 2013-06-30 NOTE — H&P (Signed)
38 year old G 6 P 2 presents for laparoscopic BTL. History of 2 C sections  Medical History: Migraines Hypothyroidism  Surgical History: As above  Medications : see list  Allergic to PREDNISONE and VERSED and DECONGESTANTS  Family history unremarkable  Social History Married 1/4 pack of tobacco per day  Afebrile VSS General alert and oriented Lung CTAB Car RRR Abdomen is soft and non tender Pelvic Normal  IMPRESSION: Desires Permanent sterilization  PLAN: Laparoscopic BTL Consent signed Risks of failure reviewed with patient

## 2013-07-05 ENCOUNTER — Encounter (HOSPITAL_COMMUNITY): Payer: Self-pay | Admitting: Pharmacist

## 2013-07-06 ENCOUNTER — Encounter (HOSPITAL_COMMUNITY)
Admission: RE | Admit: 2013-07-06 | Discharge: 2013-07-06 | Disposition: A | Discharge status: Home / Self Care | Payer: 59 | Source: Ambulatory Visit | Attending: Obstetrics and Gynecology | Admitting: Obstetrics and Gynecology

## 2013-07-06 ENCOUNTER — Encounter (HOSPITAL_COMMUNITY): Payer: Self-pay

## 2013-07-06 HISTORY — DX: Adverse effect of intravenous anesthetics, initial encounter: T41.1X5A

## 2013-07-06 HISTORY — DX: Cardiac arrhythmia, unspecified: I49.9

## 2013-07-06 LAB — CBC
HCT: 37.7 % (ref 36.0–46.0)
Hemoglobin: 13.4 g/dL (ref 12.0–15.0)
MCH: 31.8 pg (ref 26.0–34.0)
MCHC: 35.5 g/dL (ref 30.0–36.0)
MCV: 89.5 fL (ref 78.0–100.0)
PLATELETS: 242 10*3/uL (ref 150–400)
RBC: 4.21 MIL/uL (ref 3.87–5.11)
RDW: 12.5 % (ref 11.5–15.5)
WBC: 6.7 10*3/uL (ref 4.0–10.5)

## 2013-07-06 LAB — BASIC METABOLIC PANEL
BUN: 13 mg/dL (ref 6–23)
CALCIUM: 9.4 mg/dL (ref 8.4–10.5)
CO2: 25 mEq/L (ref 19–32)
CREATININE: 0.92 mg/dL (ref 0.50–1.10)
Chloride: 105 mEq/L (ref 96–112)
GFR calc non Af Amer: 77 mL/min — ABNORMAL LOW (ref >90)
GFR, EST AFRICAN AMERICAN: 89 mL/min — AB (ref >90)
Glucose, Bld: 94 mg/dL (ref 70–99)
Potassium: 4 mEq/L (ref 3.7–5.3)
Sodium: 141 mEq/L (ref 137–147)

## 2013-07-06 NOTE — Patient Instructions (Addendum)
Your procedure is scheduled on:07/08/13  Enter through the Main Entrance at : 6am Pick up desk phone and dial [phone] and inform us of your arrival.  Please call [phone] if you have any problems the morning of surgery.  Remember: Do not eat food or drink liquids after midnight:Wed   You may brush your teeth the morning of surgery.  Take these meds the morning of surgery with a sip of water: Synthroid, Coreg, Zantac  DO NOT wear jewelry, eye make-up, lipstick,body lotion, or dark fingernail polish.  (Polished toes are ok) You may wear deodorant.  If you are to be admitted after surgery, leave suitcase in car until your room has been assigned. Patients discharged on the day of surgery will not be allowed to drive home. Wear loose fitting, comfortable clothes for your ride home.

## 2013-07-07 ENCOUNTER — Encounter (HOSPITAL_COMMUNITY): Payer: Self-pay | Admitting: Anesthesiology

## 2013-07-07 NOTE — Anesthesia Preprocedure Evaluation (Addendum)
Anesthesia Evaluation  Patient identified by MRN, date of birth, ID band Patient awake    Reviewed: Allergy & Precautions, H&P , NPO status , Patient's Chart, lab work & pertinent test results, reviewed documented beta blocker date and time   History of Anesthesia Complications (+) PONV and history of anesthetic complications  Airway       Dental   Pulmonary Current Smoker,          Cardiovascular negative cardio ROS  + dysrhythmias     Neuro/Psych  Headaches, negative psych ROS   GI/Hepatic Neg liver ROS, GERD-  Controlled and Medicated,  Endo/Other  Hypothyroidism   Renal/GU negative Renal ROS  negative genitourinary   Musculoskeletal  (+) Arthritis -,   Abdominal   Peds  Hematology negative hematology ROS (+)   Anesthesia Other Findings   Reproductive/Obstetrics Desires Sterilization                         Anesthesia Physical Anesthesia Plan  ASA: II  Anesthesia Plan: General   Post-op Pain Management:    Induction: Intravenous  Airway Management Planned: Oral ETT  Additional Equipment:   Intra-op Plan:   Post-operative Plan: Extubation in OR  Informed Consent: I have reviewed the patients History and Physical, chart, labs and discussed the procedure including the risks, benefits and alternatives for the proposed anesthesia with the patient or authorized representative who has indicated his/her understanding and acceptance.   Dental advisory given  Plan Discussed with: CRNA, Anesthesiologist and Surgeon  Anesthesia Plan Comments:        recent URI 3 weeks ago, but slow to resolve.  Discussed risks associated with recent URI and she understands.  She is afebrile and her lungs are clear.  Her throat is still sore but she is not coughing anymore.  She would like to proceed. Anesthesia Quick Evaluation

## 2013-07-08 ENCOUNTER — Ambulatory Visit (HOSPITAL_COMMUNITY): Payer: 59 | Admitting: Anesthesiology

## 2013-07-08 ENCOUNTER — Ambulatory Visit (HOSPITAL_COMMUNITY)
Admission: RE | Admit: 2013-07-08 | Discharge: 2013-07-08 | Disposition: A | Discharge status: Home / Self Care | Payer: 59 | Source: Ambulatory Visit | Attending: Obstetrics and Gynecology | Admitting: Obstetrics and Gynecology

## 2013-07-08 ENCOUNTER — Encounter (HOSPITAL_COMMUNITY): Payer: Self-pay | Admitting: *Deleted

## 2013-07-08 ENCOUNTER — Encounter (HOSPITAL_COMMUNITY): Payer: 59 | Admitting: Anesthesiology

## 2013-07-08 ENCOUNTER — Encounter (HOSPITAL_COMMUNITY)
Admission: RE | Disposition: A | Discharge status: Home / Self Care | Payer: Self-pay | Source: Ambulatory Visit | Attending: Obstetrics and Gynecology

## 2013-07-08 DIAGNOSIS — Z6827 Body mass index (BMI) 27.0-27.9, adult: Secondary | ICD-10-CM

## 2013-07-08 DIAGNOSIS — M25532 Pain in left wrist: Secondary | ICD-10-CM

## 2013-07-08 DIAGNOSIS — E039 Hypothyroidism, unspecified: Secondary | ICD-10-CM

## 2013-07-08 DIAGNOSIS — Z302 Encounter for sterilization: Secondary | ICD-10-CM | POA: Insufficient documentation

## 2013-07-08 DIAGNOSIS — G8929 Other chronic pain: Secondary | ICD-10-CM

## 2013-07-08 DIAGNOSIS — R519 Headache, unspecified: Secondary | ICD-10-CM

## 2013-07-08 DIAGNOSIS — R51 Headache: Secondary | ICD-10-CM

## 2013-07-08 DIAGNOSIS — K219 Gastro-esophageal reflux disease without esophagitis: Secondary | ICD-10-CM

## 2013-07-08 DIAGNOSIS — E785 Hyperlipidemia, unspecified: Secondary | ICD-10-CM

## 2013-07-08 DIAGNOSIS — F17200 Nicotine dependence, unspecified, uncomplicated: Secondary | ICD-10-CM

## 2013-07-08 DIAGNOSIS — Z9851 Tubal ligation status: Secondary | ICD-10-CM

## 2013-07-08 DIAGNOSIS — G4700 Insomnia, unspecified: Secondary | ICD-10-CM

## 2013-07-08 DIAGNOSIS — I499 Cardiac arrhythmia, unspecified: Secondary | ICD-10-CM | POA: Insufficient documentation

## 2013-07-08 HISTORY — PX: LAPAROSCOPIC TUBAL LIGATION: SHX1937

## 2013-07-08 HISTORY — PX: LAPAROSCOPIC LYSIS OF ADHESIONS: SHX5905

## 2013-07-08 LAB — PREGNANCY, URINE: Preg Test, Ur: NEGATIVE

## 2013-07-08 SURGERY — LIGATION, FALLOPIAN TUBE, LAPAROSCOPIC
Anesthesia: General | Site: Abdomen

## 2013-07-08 MED ORDER — CEFAZOLIN SODIUM-DEXTROSE 2-3 GM-% IV SOLR: 2.0000 g | INTRAVENOUS | Status: DC

## 2013-07-08 MED ORDER — ONDANSETRON HCL 4 MG/2ML IJ SOLN
4.0000 mg | Freq: Once | INTRAMUSCULAR | Status: AC
  Administered 2013-07-08: 4 mg via INTRAVENOUS

## 2013-07-08 MED ORDER — PROPOFOL 10 MG/ML IV EMUL
INTRAVENOUS | Status: AC
  Filled 2013-07-08: qty 20

## 2013-07-08 MED ORDER — ONDANSETRON HCL 4 MG/2ML IJ SOLN
INTRAMUSCULAR | Status: AC
  Administered 2013-07-08: 4 mg via INTRAVENOUS
  Filled 2013-07-08: qty 2

## 2013-07-08 MED ORDER — KETOROLAC TROMETHAMINE 30 MG/ML IJ SOLN
INTRAMUSCULAR | Status: DC | PRN
  Administered 2013-07-08: 30 mg via INTRAVENOUS

## 2013-07-08 MED ORDER — DIPHENHYDRAMINE HCL 50 MG/ML IJ SOLN
INTRAMUSCULAR | Status: AC
  Administered 2013-07-08: 12.5 mg via INTRAVENOUS
  Filled 2013-07-08: qty 1

## 2013-07-08 MED ORDER — BUPIVACAINE HCL (PF) 0.25 % IJ SOLN
INTRAMUSCULAR | Status: AC
  Filled 2013-07-08: qty 30

## 2013-07-08 MED ORDER — NEOSTIGMINE METHYLSULFATE 1 MG/ML IJ SOLN
INTRAMUSCULAR | Status: DC | PRN
  Administered 2013-07-08: 2 mg via INTRAVENOUS

## 2013-07-08 MED ORDER — NEOSTIGMINE METHYLSULFATE 1 MG/ML IJ SOLN
INTRAMUSCULAR | Status: AC
Start: 2013-07-08 — End: 2013-07-08
  Filled 2013-07-08: qty 1

## 2013-07-08 MED ORDER — MEPERIDINE HCL 25 MG/ML IJ SOLN: 6.2500 mg | INTRAMUSCULAR | Status: DC | PRN

## 2013-07-08 MED ORDER — CEFAZOLIN SODIUM-DEXTROSE 2-3 GM-% IV SOLR
INTRAVENOUS | Status: AC
  Administered 2013-07-08: 2 g via INTRAVENOUS
  Filled 2013-07-08: qty 50

## 2013-07-08 MED ORDER — METOCLOPRAMIDE HCL 5 MG/ML IJ SOLN
10.0000 mg | Freq: Once | INTRAMUSCULAR | Status: AC | PRN
  Administered 2013-07-08: 10 mg via INTRAVENOUS

## 2013-07-08 MED ORDER — LIDOCAINE HCL (CARDIAC) 20 MG/ML IV SOLN
INTRAVENOUS | Status: AC
  Filled 2013-07-08: qty 5

## 2013-07-08 MED ORDER — GLYCOPYRROLATE 0.2 MG/ML IJ SOLN
INTRAMUSCULAR | Status: AC
  Filled 2013-07-08: qty 3

## 2013-07-08 MED ORDER — METOCLOPRAMIDE HCL 5 MG/ML IJ SOLN
INTRAMUSCULAR | Status: AC
  Administered 2013-07-08: 10 mg via INTRAVENOUS
  Filled 2013-07-08: qty 2

## 2013-07-08 MED ORDER — BUPIVACAINE HCL (PF) 0.25 % IJ SOLN
INTRAMUSCULAR | Status: DC | PRN
  Administered 2013-07-08: 10 mL

## 2013-07-08 MED ORDER — ROCURONIUM BROMIDE 100 MG/10ML IV SOLN
INTRAVENOUS | Status: AC
  Filled 2013-07-08: qty 1

## 2013-07-08 MED ORDER — DIPHENHYDRAMINE HCL 50 MG/ML IJ SOLN
12.5000 mg | Freq: Once | INTRAMUSCULAR | Status: AC
  Administered 2013-07-08: 12.5 mg via INTRAVENOUS

## 2013-07-08 MED ORDER — ROCURONIUM BROMIDE 100 MG/10ML IV SOLN
INTRAVENOUS | Status: DC | PRN
  Administered 2013-07-08: 25 mg via INTRAVENOUS

## 2013-07-08 MED ORDER — LIDOCAINE HCL (CARDIAC) 20 MG/ML IV SOLN
INTRAVENOUS | Status: DC | PRN
  Administered 2013-07-08: 80 mg via INTRAVENOUS

## 2013-07-08 MED ORDER — FENTANYL CITRATE 0.05 MG/ML IJ SOLN
INTRAMUSCULAR | Status: AC
  Administered 2013-07-08: 50 ug via INTRAVENOUS
  Filled 2013-07-08: qty 2

## 2013-07-08 MED ORDER — PROPOFOL 10 MG/ML IV BOLUS
INTRAVENOUS | Status: DC | PRN
  Administered 2013-07-08: 200 mg via INTRAVENOUS

## 2013-07-08 MED ORDER — LACTATED RINGERS IV SOLN: INTRAVENOUS | Status: DC

## 2013-07-08 MED ORDER — FENTANYL CITRATE 0.05 MG/ML IJ SOLN
INTRAMUSCULAR | Status: DC | PRN
  Administered 2013-07-08 (×3): 50 ug via INTRAVENOUS
  Administered 2013-07-08: 100 ug via INTRAVENOUS

## 2013-07-08 MED ORDER — GLYCOPYRROLATE 0.2 MG/ML IJ SOLN
INTRAMUSCULAR | Status: DC | PRN
  Administered 2013-07-08: 0.4 mg via INTRAVENOUS
  Administered 2013-07-08: 0.1 mg via INTRAVENOUS

## 2013-07-08 MED ORDER — HYDROCODONE-ACETAMINOPHEN 5-325 MG PO TABS: 1.0000 | ORAL_TABLET | Freq: Four times a day (QID) | ORAL | Status: DC | PRN

## 2013-07-08 MED ORDER — ONDANSETRON HCL 4 MG/2ML IJ SOLN: 4.0000 mg | Freq: Once | INTRAMUSCULAR | Status: DC

## 2013-07-08 MED ORDER — SODIUM CHLORIDE 0.9 % IJ SOLN
INTRAMUSCULAR | Status: DC | PRN
  Administered 2013-07-08: 3 mL

## 2013-07-08 MED ORDER — DEXAMETHASONE SODIUM PHOSPHATE 10 MG/ML IJ SOLN
INTRAMUSCULAR | Status: AC
  Filled 2013-07-08: qty 1

## 2013-07-08 MED ORDER — LACTATED RINGERS IV SOLN
INTRAVENOUS | Status: DC
  Administered 2013-07-08 (×2): via INTRAVENOUS

## 2013-07-08 MED ORDER — FENTANYL CITRATE 0.05 MG/ML IJ SOLN
25.0000 ug | INTRAMUSCULAR | Status: DC | PRN
  Administered 2013-07-08: 50 ug via INTRAVENOUS

## 2013-07-08 MED ORDER — ONDANSETRON HCL 4 MG/2ML IJ SOLN
INTRAMUSCULAR | Status: AC
  Filled 2013-07-08: qty 2

## 2013-07-08 MED ORDER — FENTANYL CITRATE 0.05 MG/ML IJ SOLN
INTRAMUSCULAR | Status: AC
  Filled 2013-07-08: qty 5

## 2013-07-08 SURGICAL SUPPLY — 20 items
CATH ROBINSON RED A/P 16FR (CATHETERS) ×3 IMPLANT
CHLORAPREP W/TINT 26ML (MISCELLANEOUS) ×3 IMPLANT
CLOTH BEACON ORANGE TIMEOUT ST (SAFETY) ×3 IMPLANT
DECANTER SPIKE VIAL GLASS SM (MISCELLANEOUS) ×3 IMPLANT
DERMABOND ADVANCED (GAUZE/BANDAGES/DRESSINGS) ×1
DERMABOND ADVANCED .7 DNX12 (GAUZE/BANDAGES/DRESSINGS) ×2 IMPLANT
GLOVE BIO SURGEON STRL SZ 6.5 (GLOVE) ×3 IMPLANT
GLOVE BIOGEL PI IND STRL 7.0 (GLOVE) ×4 IMPLANT
GLOVE BIOGEL PI INDICATOR 7.0 (GLOVE) ×2
GLOVE SURG SS PI 7.0 STRL IVOR (GLOVE) ×12 IMPLANT
GOWN STRL REUS W/TWL LRG LVL3 (GOWN DISPOSABLE) ×6 IMPLANT
NEEDLE INSUFFLATION 120MM (ENDOMECHANICALS) ×3 IMPLANT
PACK LAPAROSCOPY BASIN (CUSTOM PROCEDURE TRAY) ×3 IMPLANT
SUT VIC AB 3-0 PS2 18 (SUTURE) ×1
SUT VIC AB 3-0 PS2 18XBRD (SUTURE) ×2 IMPLANT
SUT VICRYL 0 UR6 27IN ABS (SUTURE) ×3 IMPLANT
TOWEL OR 17X24 6PK STRL BLUE (TOWEL DISPOSABLE) ×6 IMPLANT
TROCAR XCEL DIL TIP R 11M (ENDOMECHANICALS) ×3 IMPLANT
WARMER LAPAROSCOPE (MISCELLANEOUS) ×3 IMPLANT
WATER STERILE IRR 1000ML POUR (IV SOLUTION) ×3 IMPLANT

## 2013-07-08 NOTE — Brief Op Note (Signed)
07/08/2013  8:20 AM  PATIENT:  [name]  38 y.o. female  PRE-OPERATIVE DIAGNOSIS: Desires permanent sterilization POST-OPERATIVE DIAGNOSIS: Same  PROCEDURE:  Procedure(s): LAPAROSCOPIC TUBAL LIGATION (Bilateral) LAPAROSCOPIC LYSIS OF ADHESIONS (N/A)  SURGEON:  Surgeon(s) and Role:    * Jeani HawkingMichelle L Ekaterina Denise, MD - Primary  PHYSICIAN ASSISTANT:   ASSISTANTS: none   ANESTHESIA:   general  EBL:  Total I/O In: 1000 [I.V.:1000] Out: -   BLOOD ADMINISTERED:none  DRAINS: none   LOCAL MEDICATIONS USED:  LIDOCAINE   SPECIMEN:  No Specimen  DISPOSITION OF SPECIMEN:  N/A  COUNTS:  YES  TOURNIQUET:  * No tourniquets in log *  DICTATION: .Other Dictation: Dictation Number [id number]  PLAN OF CARE: Discharge to home after PACU  PATIENT DISPOSITION:  PACU - hemodynamically stable.   Delay start of Pharmacological VTE agent (>24hrs) due to surgical blood loss or risk of bleeding: not applicable

## 2013-07-08 NOTE — Transfer of Care (Signed)
Immediate Anesthesia Transfer of Care Note  Patient: [name]  Procedure(s) Performed: Procedure(s): LAPAROSCOPIC TUBAL LIGATION (Bilateral) LAPAROSCOPIC LYSIS OF ADHESIONS (N/A)  Patient Location: PACU  Anesthesia Type:General  Level of Consciousness: awake, alert  and oriented  Airway & Oxygen Therapy: Patient Spontanous Breathing and Patient connected to nasal cannula oxygen  Post-op Assessment: Post -op Vital signs reviewed and stable  Post vital signs: Reviewed and stable  Complications: No apparent anesthesia complications

## 2013-07-08 NOTE — Progress Notes (Signed)
History and physical on the chart No significant changes [facility] Cons[facility] signed

## 2013-07-08 NOTE — Anesthesia Postprocedure Evaluation (Signed)
  Anesthesia Post-op Note  Anesthesia Post Note  Patient: [name]  Procedure(s) Performed: Procedure(s) (LRB): LAPAROSCOPIC TUBAL LIGATION (Bilateral) LAPAROSCOPIC LYSIS OF ADHESIONS (N/A)  Anesthesia type: General  Patient location: PACU  Post pain: Pain level controlled  Post assessment: Post-op Vital signs reviewed  Last Vitals:  Filed Vitals:   07/08/13 0930  BP: 92/48  Pulse: 57  Temp: 36.7 C  Resp: 20    Post vital signs: Reviewed  Level of consciousness: sedated  Complications: No apparent anesthesia complications

## 2013-07-08 NOTE — Discharge Instructions (Signed)
DISCHARGE INSTRUCTIONS: Laparoscopy  MAY TAKE IBUPROFEN (MOTRIN, ADVIL) OR ALEVE AFTER 2:25 PM FOR CRAMPS!!!  The following instructions have been prepared to help you care for yourself upon your return home today.  Wound care:  Do not get the incision wet for the first 24 hours. The incision should be kept clean and dry.  The Band-Aids or dressings may be removed the day after surgery.  Should the incision become sore, red, and swollen after the first week, check with your doctor.  Personal hygiene:  Shower the day after your procedure.  Activity and limitations:  Do NOT drive or operate any equipment today.  Do NOT lift anything more than 15 pounds for 2-3 weeks after surgery.  Do NOT rest in bed all day.  Walking is encouraged. Walk each day, starting slowly with 5-minute walks 3 or 4 times a day. Slowly increase the length of your walks.  Walk up and down stairs slowly.  Do NOT do strenuous activities, such as golfing, playing tennis, bowling, running, biking, weight lifting, gardening, mowing, or vacuuming for 2-4 weeks. Ask your doctor when it is okay to start.  Diet: Eat a light meal as desired this evening. You may resume your usual diet tomorrow.  Return to work: This is dependent on the type of work you do. For the most part you can return to a desk job within a week of surgery. If you are more active at work, please discuss this with your doctor.  What to expect after your surgery: You may have a slight burning sensation when you urinate on the first day. You may have a very small amount of blood in the urine. Expect to have a small amount of vaginal discharge/light bleeding for 1-2 weeks. It is not unusual to have abdominal soreness and bruising for up to 2 weeks. You may be tired and need more rest for about 1 week. You may experience shoulder pain for 24-72 hours. Lying flat in bed may relieve it.  Call your doctor for any of the following:  Develop a fever of  100.4 or greater  Inability to urinate 6 hours after discharge from hospital  Severe pain not relieved by pain medications  Persistent of heavy bleeding at incision site  Redness or swelling around incision site after a week  Increasing nausea or vomiting  Patient Signature________________________________________ Nurse Signature_________________________________________

## 2013-07-08 NOTE — Anesthesia Procedure Notes (Signed)
Procedure Name: Intubation Date/Time: 07/08/2013 7:44 AM Performed by: Graciela HusbandsFUSSELL, Maedell Hedger O Pre-anesthesia Checklist: Patient identified, Emergency Drugs available, Suction available, Patient being monitored and Timeout performed Patient Re-evaluated:Patient Re-evaluated prior to inductionOxygen Delivery Method: Circle system utilized Preoxygenation: Pre-oxygenation with 100% oxygen Intubation Type: IV induction Ventilation: Mask ventilation without difficulty Laryngoscope Size: Mac and 3 Grade View: Grade I Tube type: Oral Tube size: 7.0 mm Number of attempts: 1 Airway Equipment and Method: Patient positioned with wedge pillow and Stylet Placement Confirmation: ETT inserted through vocal cords under direct vision,  breath sounds checked- equal and bilateral and positive ETCO2 Secured at: 21 cm Tube secured with: Tape Dental Injury: Teeth and Oropharynx as per pre-operative assessment

## 2013-07-09 ENCOUNTER — Encounter (HOSPITAL_COMMUNITY): Payer: Self-pay | Admitting: Obstetrics and Gynecology

## 2013-07-09 NOTE — Op Note (Signed)
NAMMardi Acosta:  [name], [name]                ACCOUNT NO.:  [id number]  MEDICAL RECORD NO.:  [id number]  LOCATION:  [facility]                          [facility]:  [facility]  PHYSICIAN:  Belton Peplinski L. Beckham Buxbaum, M.D.DATE OF BIRTH:  [birth year]  DATE OF PROCEDURE: DATE OF DISCHARGE:  07/08/2013                              OPERATIVE REPORT   PREOPERATIVE DIAGNOSIS:  Desires permanent sterilization.  POSTOPERATIVE DIAGNOSIS:  Desires permanent sterilization.  PROCEDURE:  Laparoscopic bilateral tubal ligation and lysis of adhesions.  SURGEON:  Brennan Litzinger L. Vincente PoliGrewal, M.D.  ASSISTANT:  Not applicable.  ESTIMATED BLOOD LOSS:  Minimal.  COMPLICATIONS:  None.  DRAINS:  None.  PATHOLOGY:  None.  DESCRIPTION OF PROCEDURE:  The patient was taken to the operating room. She was intubated.  She was prepped and draped.  In and out catheter was used to empty the bladder.  Attention was turned to the abdomen where a small infraumbilical incision was made and the Veress needle was inserted and pneumoperitoneum was performed.  The Veress needle was removed.  An 11 mm trocar was inserted.  The laparoscope was introduced into the abdominal cavity.  No area of bleeding or intestinal injury was noted.  The patient was placed in Trendelenburg position.  The pelvis was inspected.  She had 2 cesarean sections, and there were some adhesions involving the uterine, fundus to the left side of the abdominal wall that were filmy and I took those down with Kleppingers and scissors in standard fashion.  The fallopian tubes appeared normal. We identified the right fallopian tube, grasped the mid portion of that, elevated it, and performed a tubal ligation using the triple burn technique, and ensuring that the wattage went down to 0.  This was performed with Kleppingers.  This was done in identical fashion on the left side.  After the tubal ligation was done bilaterally, no bleeding was noted.  The instruments were removed from the  vagina.  The pneumoperitoneum was released.  The trocar was removed.  Dermabond was applied at the incision site.  All sponge, lap, instrument counts were correct x2.  The patient went to recovery room in stable condition.     Lexie Morini L. Vincente PoliGrewal, M.D.     Florestine AversMLG/MEDQ  D:  07/08/2013  T:  07/08/2013  Job:  [id number]

## 2014-05-25 ENCOUNTER — Other Ambulatory Visit: Payer: Self-pay | Admitting: Obstetrics and Gynecology

## 2014-05-27 ENCOUNTER — Other Ambulatory Visit: Payer: Self-pay | Admitting: Obstetrics and Gynecology

## 2014-05-27 DIAGNOSIS — R928 Other abnormal and inconclusive findings on diagnostic imaging of breast: Secondary | ICD-10-CM

## 2014-05-27 LAB — CYTOLOGY - PAP

## 2014-06-06 ENCOUNTER — Ambulatory Visit
Admission: RE | Admit: 2014-06-06 | Discharge: 2014-06-06 | Disposition: A | Discharge status: Home / Self Care | Payer: 59 | Source: Ambulatory Visit | Attending: Obstetrics and Gynecology | Admitting: Obstetrics and Gynecology

## 2014-06-06 DIAGNOSIS — R928 Other abnormal and inconclusive findings on diagnostic imaging of breast: Secondary | ICD-10-CM

## 2014-07-20 ENCOUNTER — Other Ambulatory Visit: Payer: Self-pay | Admitting: Obstetrics and Gynecology

## 2014-07-20 DIAGNOSIS — Z803 Family history of malignant neoplasm of breast: Secondary | ICD-10-CM

## 2014-07-28 ENCOUNTER — Inpatient Hospital Stay: Admission: RE | Admit: 2014-07-28 | Payer: 59 | Source: Ambulatory Visit

## 2014-08-21 ENCOUNTER — Ambulatory Visit
Admission: RE | Admit: 2014-08-21 | Discharge: 2014-08-21 | Disposition: A | Discharge status: Home / Self Care | Payer: 59 | Source: Ambulatory Visit | Attending: Obstetrics and Gynecology | Admitting: Obstetrics and Gynecology

## 2014-08-21 DIAGNOSIS — Z803 Family history of malignant neoplasm of breast: Secondary | ICD-10-CM

## 2014-08-21 MED ORDER — GADOBENATE DIMEGLUMINE 529 MG/ML IV SOLN
15.0000 mL | Freq: Once | INTRAVENOUS | Status: AC | PRN
  Administered 2014-08-21: 15 mL via INTRAVENOUS

## 2014-08-25 ENCOUNTER — Other Ambulatory Visit: Payer: Self-pay | Admitting: Obstetrics and Gynecology

## 2014-08-25 DIAGNOSIS — R928 Other abnormal and inconclusive findings on diagnostic imaging of breast: Secondary | ICD-10-CM

## 2014-09-08 ENCOUNTER — Ambulatory Visit (HOSPITAL_COMMUNITY)
Admission: RE | Admit: 2014-09-08 | Discharge: 2014-09-08 | Disposition: A | Discharge status: Home / Self Care | Payer: Medicaid Other | Source: Ambulatory Visit | Attending: Obstetrics and Gynecology | Admitting: Obstetrics and Gynecology

## 2014-09-08 ENCOUNTER — Encounter (HOSPITAL_COMMUNITY): Payer: Self-pay

## 2014-09-08 VITALS — BP 118/78 | Temp 98.4°F | Ht 62.5 in | Wt 177.0 lb

## 2014-09-08 DIAGNOSIS — Z803 Family history of malignant neoplasm of breast: Secondary | ICD-10-CM | POA: Diagnosis not present

## 2014-09-08 DIAGNOSIS — Z1231 Encounter for screening mammogram for malignant neoplasm of breast: Secondary | ICD-10-CM | POA: Diagnosis present

## 2014-09-08 DIAGNOSIS — Z1239 Encounter for other screening for malignant neoplasm of breast: Secondary | ICD-10-CM

## 2014-09-08 NOTE — Progress Notes (Signed)
CLINIC:  [facility] ([facility]) Clinic  REASON FOR VISIT: Well-woman exam and breast biopsy.    HISTORY OF PRESENT ILLNESS:  Ms. [name] is a 39 y.o. female who presents to the [facility] today for clinical breast exam. On 06/06/14, she had a screening mammogram that was negative.  She has a family history of breast cancer in her mother and maternal aunt.  As a result of that family history, a breast MRI was performed on 08/21/14 revealing 1.5 cm area of linear non-mass enhancement within the middle 1/3 of the right breast at 5:30 o'clock position.  An MR-guided biopsy was recommended.  This has been scheduled for 09/12/14.  Ms. [name] endorses periodic breast tenderness.  Her last pap smear was in 05/25/14 and was negative.  She does have a history of abnormal pap smear at age 44.    REVIEW OF SYSTEMS:  Denies any breast pain, nodularity, nipple inversion, or nipple discharge bilaterally.   ALLERGIES: Allergies  Allergen Reactions  . Decongest-Aid [Pseudoephedrine] Other (See Comments)    TACHYCARDIA  . Prednisone Other (See Comments)    TACHYCARDIA  . Versed [Midazolam] Other (See Comments)    SEVERE ANGER REACTION    CURRENT MEDICATIONS:  Current Outpatient Prescriptions on File Prior to Encounter  Medication Sig Dispense Refill  . atorvastatin (LIPITOR) 10 MG tablet Take 10 mg by mouth daily.    . carvedilol (COREG) 12.5 MG tablet Take 12.5 mg by mouth 2 (two) times daily with a meal.    . HYDROcodone-acetaminophen (NORCO) 5-325 MG per tablet Take 1 tablet by mouth every 6 (six) hours as needed for moderate pain. 30 tablet 0  . levothyroxine (SYNTHROID, LEVOTHROID) 88 MCG tablet Take 88 mcg by mouth daily before breakfast.    . ranitidine (ZANTAC) 150 MG tablet Take 150 mg by mouth 2 (two) times daily.    Marland Kitchen zolpidem (AMBIEN) 10 MG tablet Take 10 mg by mouth at bedtime as needed.    . benzonatate (TESSALON) 100 MG capsule Take 1-2 capsules (100-200 mg total) by  mouth 3 (three) times daily as needed for cough. (Patient not taking: Reported on 09/08/2014) 40 capsule 0  . OVER THE COUNTER MEDICATION Take 1 capsule by mouth daily. Essential Oil    . tiZANidine (ZANAFLEX) 4 MG capsule Take 4 mg by mouth 2 (two) times daily as needed (Migraines). Not to exceed more then 2 times weekly    . zonisamide (ZONEGRAN) 100 MG capsule Take 150 mg by mouth daily.      No current facility-administered medications on file prior to encounter.     PHYSICAL EXAM:  Vitals:  Filed Vitals:   09/08/14 1147  BP: 118/78  Temp: 98.4 F (36.9 C)   General: Well-nourished, well-appearing female in no acute distress.  She is unaccompanied in clinic today.  Stoney Bang, LPN was present during physical exam for this patient.  Breasts: Bilateral breasts exposed and observed with patient standing (arms at side, arms on hips, arms on hips flexed forward, and arms over head).  No gross abnormalities including breast skin puckering or dimpling noted on observation.  Breasts symmetrical without evidence of skin redness, thickening, or peau d'orange appearance. No nipple retraction or nipple discharge noted bilaterally.  Bilateral breast tissue is very dense and fibrocystic changes noted.  No discrete mass palpated in either breast, with specific attention paid to the 5:30 o'clock position.  No breast nodularity palpated in bilateral breasts.   Axillary lymph nodes:  Patient with what appears to be large fatty deposits in bilateral axilla. No palpable lymphadenopathy bilaterally.  GU: Exam deferred. Pap smear is up-to-date.  ASSESSMENT & PLAN:   1. Breast cancer screening: Ms. [name] has no palpable breast abnormalities on her clinical breast exam today.  She will receive her breast biopsy as scheduled based on the suspicious MRI finding on 08/21/14.  as scheduled.  She will be contacted by the imaging center for results of the biopsy, either by letter or phone within the next few weeks.   She was given instructions and educational materials regarding breast self-awareness. Ms. [name] is aware of this plan and agrees with it.    Ms. [name] was encouraged to ask questions and all questions were answered to her satisfaction.    Kara Basque, NP [facility]  [phone]

## 2014-09-12 ENCOUNTER — Ambulatory Visit
Admission: RE | Admit: 2014-09-12 | Discharge: 2014-09-12 | Disposition: A | Discharge status: Home / Self Care | Payer: No Typology Code available for payment source | Source: Ambulatory Visit | Attending: Obstetrics and Gynecology | Admitting: Obstetrics and Gynecology

## 2014-09-12 DIAGNOSIS — R928 Other abnormal and inconclusive findings on diagnostic imaging of breast: Secondary | ICD-10-CM

## 2014-09-12 MED ORDER — GADOBENATE DIMEGLUMINE 529 MG/ML IV SOLN
15.0000 mL | Freq: Once | INTRAVENOUS | Status: AC | PRN
Start: 2014-09-12 — End: 2014-09-12
  Administered 2014-09-12: 15 mL via INTRAVENOUS

## 2016-02-19 ENCOUNTER — Encounter: Payer: Self-pay | Admitting: Neurology

## 2016-02-19 ENCOUNTER — Ambulatory Visit (INDEPENDENT_AMBULATORY_CARE_PROVIDER_SITE_OTHER): Payer: Medicaid Other | Admitting: Neurology

## 2016-02-19 VITALS — BP 119/73 | HR 70 | Resp 20 | Ht 63.0 in | Wt 186.0 lb

## 2016-02-19 DIAGNOSIS — IMO0001 Reserved for inherently not codable concepts without codable children: Secondary | ICD-10-CM | POA: Insufficient documentation

## 2016-02-19 DIAGNOSIS — E6609 Other obesity due to excess calories: Secondary | ICD-10-CM

## 2016-02-19 DIAGNOSIS — F5104 Psychophysiologic insomnia: Secondary | ICD-10-CM

## 2016-02-19 DIAGNOSIS — R0683 Snoring: Secondary | ICD-10-CM

## 2016-02-19 DIAGNOSIS — F419 Anxiety disorder, unspecified: Secondary | ICD-10-CM | POA: Insufficient documentation

## 2016-02-19 NOTE — Progress Notes (Signed)
SLEEP MEDICINE CLINIC   Provider:  Melvyn Novas, M D  Referring Provider: Martha Clan, MD Primary Care Physician:  Martha Clan, MD  Chief Complaint  Patient presents with  . New Patient (Initial Visit)    always tired, headaches    HPI:  [name] is a 40 y.o. female , seen here as a referral from Dr. Clelia Croft for a sleep consultation,   [name] has suffered from chronic insomnia, using Ambien by now for over 2 years. She used it periodically prior to that.  Chronic day by day use has been established for at least 24 month. She also has a medical history of excessive daytime sleepiness, chronic fatigue, insomnia, migrainous headaches with status migrainosus, sleep related headaches, obesity, anxiety . She has hypothyroidism but noticed that she felt more and more exhausted in spite of adjusted doses of Synthroid.  Unable to hear the clock in the morning unable to wake up a set alarm clock and another 18 alarms on her phone (!). Kids for of late to school and she is not rested in the morning even when she feels that she got good sleep. The trouble falling asleep has been compensated by Ambien but she is daytime sleepy and her children report that she snores very loudly. She is enrolled in a study for migraine at [facility].  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 She was no longer able to work as a Radiation protection practitioner since 2013 when she was injured at the job. There was Xanax and melatonin used as well, with less success. Xanax caused a hang- over feeling.   Sleep habits are as follows: Her children usually are in bed by 9 PM and asleep by 9:30, and she is trying to adjust her own cycle to theirs. If she doesn't take Ambien she may lay awake for several hours until 2 or 3 AM at night, when she takes Ambien she is usually asleep by 10.30 PM. She has established a ritual where she takes a hot shower  before going to bed after her children are in bed. She usually takes her Ambien at that time. She sleeps on 2 pillows, she is a side sleeper. The bedroom is described as cool, quiet and dark. She does watch TV in bed when she has trouble falling asleep.  She failed a sound machine , it couldn't relax her as well. She reports that she has vivid dreams more and more dream intrusions, many dreams are fearful, nightmarish in character. She also reports that the dreams are very real and it is difficult  to distinguish between dreams and reality.  She works in [employer] on weekends, dinner time , returns home by 10 PM. She attends school for the rest of the week, training as an Public librarian.  She wakes up between 3 and 5 AM can go back to sleep but her sleep is fragmented. She does not have to go to the bathroom at night. Her children reported to snore loudly, there is some crescendo snoring. Her alarm clock is set for 6 AM, 6:15 AM, and so forth. She has great difficulties waking up in the morning and leaving the bed and often oversleeps. Her children have to be at school at 8:30 and her school starts at the same time. She reports that she can't even hear the alarm. This has become a problem over the last  6 months.  Sleep medical history and family sleep history: No history of sleepwalking, enuresis, night terrors. She reports vivid dreams that have progressed over the last 2 years. She has gained weight over the last 2 years. She also has a mother who is extremely daytime fatigue and sleepy, her mother was also diagnosed with obstructive sleep apnea but is not using CPAP.   Social history: he quit smoking in 2016 drinks 4 caffeinated beverages on average a day, does not use alcohol. No exercise regimen in place.  Walks in summer 2 miles four days a week. Divorced with children 2015 ,  children's age now 6112 and 7210.   Review of Systems: Out of a complete 14 system review, the patient complains of only the  following symptoms, and all other reviewed systems are negative.  The patient reports that her vision has always been checked as normal but she feels that her headaches are best qualified as a retro-orbital pressure behind the right eye. She can get nauseated, she has photophobia with her headaches, headaches can last 3 days and she cannot sleep them off. EDS, snoring, oversleeping.   Epworth score  13 , Fatigue severity score 55  , depression score 4/15    Social History   Social History  . Marital status: Married    Spouse name: N/A  . Number of children: N/A  . Years of education: N/A   Occupational History  . Not on file.   Social History Main Topics  . Smoking status: Current Every Day Smoker    Packs/day: 0.25    Years: 22.00    Types: E-cigarettes  . Smokeless tobacco: Never Used     Comment: pt cutting back on her own  . Alcohol use Yes     Comment: rarely  . Drug use: No  . Sexual activity: Yes    Birth control/ protection: Surgical   Other Topics Concern  . Not on file   Social History Narrative  . No narrative on file    Family History  Problem Relation Age of Onset  . Breast cancer Mother   . Diabetes Mother   . Hypertension Mother   . Hypertension Father   . Breast cancer Maternal Aunt   . Diabetes Paternal Aunt   . Hypertension Maternal Grandmother   . Hypertension Maternal Grandfather   . Hypertension Paternal Grandmother   . Diabetes Paternal Grandfather   . Hypertension Paternal Grandfather     Past Medical History:  Diagnosis Date  . Adverse effect of intravenous anesthetic    Versed  . Astigmatism    mild  . Chronic back pain greater than 3 months duration   . Complication of anesthesia   . Cough 12/24/2011  . Dysrhythmia    tachycardia controlled with med  . GERD (gastroesophageal reflux disease)   . Headache(784.0)    migraines  . High cholesterol   . Hypothyroidism   . PONV (postoperative nausea and vomiting)   . Postural  orthostatic tachycardia syndrome   . Snores   . 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    Past Surgical History:  Procedure Laterality Date  . CARDIAC CATHETERIZATION  2008   normal  . CESAREAN SECTION  05/03/2003; 02/06/2006  . DILATION AND CURETTAGE OF UTERUS  05/01/2009  . INTRAUTERINE DEVICE INSERTION  05/01/2009  . IUD REMOVAL  05/01/2009  . LAPAROSCOPIC LYSIS OF ADHESIONS N/A 07/08/2013   Procedure: LAPAROSCOPIC LYSIS OF ADHESIONS;  Surgeon:  Jeani HawkingMichelle L Grewal, MD;  Location: [facility];  Service: Gynecology;  Laterality: N/A;  . LAPAROSCOPIC TUBAL LIGATION Bilateral 07/08/2013   Procedure: LAPAROSCOPIC TUBAL LIGATION;  Surgeon: Jeani HawkingMichelle L Grewal, MD;  Location: [facility];  Service: Gynecology;  Laterality: Bilateral;  . WRIST ARTHROSCOPY  12/31/2011   Procedure: ARTHROSCOPY WRIST;  Surgeon: Wyn Forsterobert V Sypher Jr., MD;  Location: [facility];  Service: Orthopedics;  Laterality: Left;  Open triangular fibrocartilage complex repair    Current Outpatient Prescriptions  Medication Sig Dispense Refill  . ALPRAZolam (XANAX) 0.5 MG tablet Take 0.5 mg by mouth daily as needed.    Marland Kitchen. atorvastatin (LIPITOR) 20 MG tablet Take 20 mg by mouth daily.    Marland Kitchen. escitalopram (LEXAPRO) 20 MG tablet Take 20 mg by mouth daily.    . fluticasone (FLONASE) 50 MCG/ACT nasal spray Place into both nostrils daily.    Marland Kitchen. HYDROcodone-acetaminophen (NORCO) 5-325 MG per tablet Take 1 tablet by mouth every 6 (six) hours as needed for moderate pain. 30 tablet 0  . levothyroxine (SYNTHROID, LEVOTHROID) 88 MCG tablet Take 88 mcg by mouth daily before breakfast.    . methocarbamol (ROBAXIN) 500 MG tablet Take 500 mg by mouth every 8 (eight) hours as needed for muscle spasms.    Marland Kitchen. OVER THE COUNTER MEDICATION Take 1 capsule by mouth daily. Essential Oil    . propranolol ER (INDERAL LA) 60 MG 24 hr capsule Take 60 mg by mouth daily.  4  . ranitidine (ZANTAC) 150 MG tablet Take 150 mg by mouth  2 (two) times daily.    Marland Kitchen. UNABLE TO FIND Med Name: Trial medication for headaches. Pt is in a clinical trial.    . valACYclovir (VALTREX) 1000 MG tablet Take 1,000 mg by mouth daily as needed.    . zolpidem (AMBIEN) 10 MG tablet Take 10 mg by mouth at bedtime as needed.     No current facility-administered medications for this visit.     Allergies as of 02/19/2016 - Review Complete 02/19/2016  Allergen Reaction Noted  . Decongest-aid [pseudoephedrine] Other (See Comments) 12/24/2011  . Prednisone Other (See Comments) 12/24/2011  . Versed [midazolam] Other (See Comments) 12/24/2011    Vitals: BP 119/73   Pulse 70   Resp 20   Ht 5\' 3"  (1.6 m)   Wt 186 lb (84.4 kg)   BMI 32.95 kg/m  Last Weight:  Wt Readings from Last 1 Encounters:  02/19/16 186 lb (84.4 kg)   ZOX:WRUEBMI:Body mass index is 32.95 kg/m.     Last Height:   Ht Readings from Last 1 Encounters:  02/19/16 5\' 3"  (1.6 m)    Physical exam:  General: The patient is awake, alert and appears not in acute distress. The patient is well groomed. Head: Normocephalic, atraumatic. Neck is supple. Mallampati 3  neck circumference:15. 5  Nasal airflow patent today, frequently congested, TMJ click not evident . Retrognathia is seen. Bruxism marks.  Cardiovascular:  Regular rate and rhythm , without murmurs or carotid bruit, and without distended neck veins. Respiratory: Lungs are clear to auscultation. Skin:  Without evidence of edema, or rash Trunk: BMI is elevated The patient's posture is erect   Neurologic exam : The patient is awake and alert, oriented to place and time.   Attention span & concentration ability appears restricted - the patient reports that she has trouble paying attention due to fatigue and drowsiness. Speech is fluent,  without  dysarthria, dysphonia or aphasia.  Mood and affect are appropriate.  Cranial nerves: Pupils are equal and briskly reactive to light. Funduscopic exam without evidence of pallor or  edema.  Extraocular movements  in vertical and horizontal planes intact and without nystagmus. Visual fields by finger perimetry are intact. Hearing to finger rub intact. Facial sensation intact to fine touch.Facial motor strength is symmetric and tongue and uvula move midline. Shoulder shrug was symmetrical.  Motor exam:  Normal tone, muscle bulk and symmetric strength in all extremities. Sensory:  Fine touch, pinprick and vibration were tested-Proprioception tested in the upper extremities was normal. Coordination: Rapid alternating movements in the fingers/hands was normal. Finger-to-nose maneuver without evidence of ataxia, dysmetria or tremor.  Gait and station: Patient walks without assistive device and is able unassisted to climb up to the exam table. Strength within normal limits.  Stance is stable and normal.  Deep tendon reflexes: in the upper and lower extremities are symmetric and intact. Babinski maneuver response is downgoing.  The patient was advised of the nature of the diagnosed sleep disorder , the treatment options and risks for general a health and wellness arising from not treating the condition.  I spent more than 40 minutes of face to face time with the patient. Greater than 50% of time was spent in counseling and coordination of care. We have discussed the diagnosis and differential and I answered the patient's questions.     Assessment:  After physical and neurologic examination, review of laboratory studies,  Personal review of imaging studies, reports of other /same  Imaging studies ,  Results of polysomnography/ neurophysiology testing and pre-existing records as far as provided in visit., my assessment is   1)  OSA / EDS ? Insomnia.  [name] has several risk factors qualifying her for a sleep study first of all she is overweight and has gained weight over the last 2 years, her's children have hurt her snoring but they do not know yet what apnea means or is further  couldn't reported. I think that she has a high chance of having obstructive sleep apnea given the BMI, neck circumference.  2)The patient has chronic insomnia and sleep initiation insomnia is usually not related to apnea but a chronic finding. It is more often related to depression or anxiety or ruminating thoughts.  She does have a degree of fatigue and sleepiness and daytime that makes [place] suspicious for having narcolepsy. She reports frequently vivid dreams. Her dreams are so vivid that they feel real to her. She has not reported sleep paralysis or cataplexy.  [name]'s   risk factors are somewhat modifiable, she is interested in weight loss and exercise regimen but she also would be happy to use a medication allowing her to stay awake in daytime showed apnea not be the cause of her hypersomnia.   I will order a split night polysomnography for the patient given my high suspicion of her having OSA. I will also give her some instructions on sleep hygiene, sleep routines, and like for her to take melatonin at night and to try not to take Ambien on weekends. I wanted to avoid daytime naps in order to allow her to establish better sleep at night. TV and other illuminated screen devices should not be in the bedroom with her. Chronic Ambien use has been linked to similar pre-morbidities as chronic benzodiazepine use, there is a high risk of addiction, physical dependence as well as psychological dependence, and possible cognitive and memory decline.      Plan:  Treatment plan and additional  workup :  PSG ,   Please remember to try to maintain good sleep hygiene, which means: Keep a regular sleep and wake schedule, try not to exercise or have a meal within 2 hours of your bedtime, try to keep your bedroom conducive for sleep, that is, cool and dark, without light distractors such as an illuminated alarm clock, and refrain from watching TV right before sleep or in the middle of the night and do not  keep the TV or radio on during the night. Also, try not to use or play on electronic devices at bedtime, such as your cell phone, tablet PC or laptop. If you like to read at bedtime on an electronic device, try to dim the background light as much as possible. Do not eat in the middle of the night.   We will request a sleep study.    We will look for leg twitching and snoring or sleep apnea.   For chronic insomnia, you are best followed by a psychiatrist and/or sleep psychologist.   We will call you with the sleep study results and make a follow up appointment if needed.        Porfirio Mylar Leslea Vowles MD  02/19/2016   CC: Martha Clan, Md [address] [place], [place] [address]

## 2016-02-19 NOTE — Patient Instructions (Signed)

## 2016-03-03 ENCOUNTER — Ambulatory Visit (INDEPENDENT_AMBULATORY_CARE_PROVIDER_SITE_OTHER): Payer: Medicaid Other | Admitting: Neurology

## 2016-03-03 DIAGNOSIS — R0683 Snoring: Secondary | ICD-10-CM

## 2016-03-03 DIAGNOSIS — G4733 Obstructive sleep apnea (adult) (pediatric): Secondary | ICD-10-CM

## 2016-03-03 DIAGNOSIS — F419 Anxiety disorder, unspecified: Secondary | ICD-10-CM

## 2016-03-03 DIAGNOSIS — F5104 Psychophysiologic insomnia: Secondary | ICD-10-CM

## 2016-03-03 DIAGNOSIS — IMO0001 Reserved for inherently not codable concepts without codable children: Secondary | ICD-10-CM

## 2016-03-04 ENCOUNTER — Institutional Professional Consult (permissible substitution): Payer: Medicaid Other | Admitting: Neurology

## 2016-03-14 ENCOUNTER — Telehealth: Payer: Self-pay | Admitting: Neurology

## 2016-03-14 DIAGNOSIS — G4719 Other hypersomnia: Secondary | ICD-10-CM

## 2016-03-14 DIAGNOSIS — G4733 Obstructive sleep apnea (adult) (pediatric): Secondary | ICD-10-CM

## 2016-03-14 NOTE — Procedures (Signed)
PATIENT'S NAME:  [name], [name] DOB:      1976/02/01      MR#:    [id number]     DATE OF RECORDING: 03/03/2016 REFERRING M.D.:  Carolin CoyWillam Shaw, MD Study Performed:   Baseline Polysomnogram HISTORY:  40 year old female with significant hypersomnia,  and hypothyroidism but noticed that she felt more and more exhausted in spite of adjusted doses of Synthroid. "Unable to hear the clock in the morning, unable to wake up". She set alarm clock and another 18 alarms on her phone (!). Still, her kids are often late to school. Her children report that she snores very loudly. She is enrolled in a study for migraine.  Astigmatism, migraine, back pain, cough, dysrhythmia, GERD, high cholesterol, hypothyroidism, PONV and snores The patient endorsed the Epworth Sleepiness Scale at 13/24 points.   The patient's weight 186 pounds with a height of 63 (inches), resulting in a BMI of 32.8 kg/m2. The patient's neck circumference measured 15.5 inches.  CURRENT MEDICATIONS: Alprazolam, Atorvastatin, Escitalopram, Fluticasone, Hydrocodone, Levothyroxine, Methocarbamol, Propranolol, Ranitidine, Valacyclovir and Zolpidem   PROCEDURE:  This is a multichannel digital polysomnogram utilizing the Somnostar 11.2 system.  Electrodes and sensors were applied and monitored per AASM Specifications.   EEG, EOG, Chin and Limb EMG, were sampled at 200 Hz.  ECG, Snore and Nasal Pressure, Thermal Airflow, Respiratory Effort, CPAP Flow and Pressure, Oximetry was sampled at 50 Hz. Digital video and audio were recorded.      BASELINE STUDY  Lights Out was at 21:23 and Lights On at 05:14.  Total recording time (TRT) was 471 minutes, with a total sleep time (TST) of 303 minutes.   The patient's sleep latency was 67 minutes.  REM latency was 133 minutes.  The sleep efficiency was poor at 64.3 %.     SLEEP ARCHITECTURE: WASO (Wake after sleep onset) was 107 minutes.  There were 26.5 minutes in Stage N1, 229.5 minutes Stage N2, 22 minutes Stage N3  and 25 minutes in Stage REM.  The percentage of Stage N1 was 8.7%, Stage N2 was 75.7%, Stage N3 was 7.3% and Stage R (REM sleep) was 8.3%.   RESPIRATORY ANALYSIS:  There were a total of 32 respiratory events:  0 apneas and 32 hypopneas ,0 respiratory event related arousals (RERAs).     The total APNEA/HYPOPNEA INDEX (AHI) was 6.3/hour and the total RESPIRATORY DISTURBANCE INDEX was 6.3 /hour.  9 events occurred in REM sleep and 46 events in NREM. The REM AHI was 21.6 /hour, versus a non-REM AHI of 5.0. The patient spent 108 minutes of total sleep time in the supine position and 195 minutes in non-supine.. The supine AHI was 8.9 versus a non-supine AHI of 4.9.  OXYGEN SATURATION & C02:  The Wake baseline 02 saturation was 98%, with the lowest being 85%. Time spent below 89% saturation equaled 2 minutes.   PERIODIC LIMB MOVEMENTS:   The patient had a total of 0 Periodic Limb Movements.   Audio and video analysis did not show any abnormal or unusual movements, behaviors, phonations or vocalizations.   The patient took no bathroom breaks.  Snoring was noted.  EKG was in keeping with normal sinus rhythm (NSR).   IMPRESSION:  1. Mild  Obstructive Sleep Apnea (OSA) at AHI 6.3 but loud snoring. Her REM AHI was 21.6 and supine AHI was 8.6. 2. Insomnia, which can only be partially caused by OSA, as the patient has trouble to initiate sleep as well as staying asleep.  3. Repetitive Intrusions of Sleep  RECOMMENDATIONS: 4. Excessive daytime sleepiness and very high Fatigue.  Advise full-night, attended, CPAP titration study to optimize therapy.   Weight loss is advised. Ambien should not be used every night, but reduced to 4-5 nights a week.  1. Avoid sedative-hypnotics which may worsen sleep apnea, alcohol and tobacco (as applicable). 2. Advise patient to avoid driving or operating hazardous machinery when sleepy. 3. Further information regarding OSA may be obtained from [employer]  (www.sleepfoundation.org) or American Sleep Apnea Association (www.sleepapnea.org). 4. A follow up appointment [facility]. The referring provider will be notified of the results.      I certify that I have reviewed the entire raw data recording prior to the issuance of this report in accordance with the Standards of Accreditation of the American Academy of Sleep Medicine (AASM)      Melvyn Novasarmen Eltha Tingley, MD   03-14-2016  Diplomat, American Board of Psychiatry and Neurology  Diplomat, American Board of Sleep Medicine Medical Director, [facility]

## 2016-03-21 ENCOUNTER — Telehealth: Payer: Self-pay | Admitting: Neurology

## 2016-03-21 IMAGING — MR MR BREAST BX W/ LOC DEV 1ST LEASION IMAGE BX SPEC MR GUIDE*R*
6 of 9 series · 33 of 48 positions shown · IV contrast (15ml Multihance)
Comparison: Previous exams.

ADDENDUM:
Pathology reveals Right breast fibroadenoma with fibrocystic
changes. This was found to be concordant by Dr. Bikru Ebrhim.
Pathology results were discussed with the patient via telephone. The
patient reported tenderness at the biopsy site. Post biopsy
instructions were reviewed and questions were answered. The patient
was encouraged to call The [REDACTED] with
any additional questions and or concerns. The patient was instructed
to perform monthly self breast examinations and to return to

Pathology results reported by Winnie Tiger RN on September 13, 2014.
CLINICAL DATA: Right breast 5:30 o'clock area of linear enhancement
measuring 1.5 cm seen on a recent screening MRI performed in a high
risk patient.
EXAM:
MRI GUIDED CORE NEEDLE BIOPSY OF THE RIGHT BREAST
TECHNIQUE: Multiplanar, multisequence MR imaging of the right breast was
performed both before and after administration of intravenous
contrast.
CONTRAST:  15mL MULTIHANCE GADOBENATE DIMEGLUMINE 529 MG/ML IV SOLN

[Series 2: axial pre-cm · axial · non-contrast · 1.3mm · 0.73mm/px · z∈[-77,+109]mm · 6 of 144 slices shown]
[im 1/144]
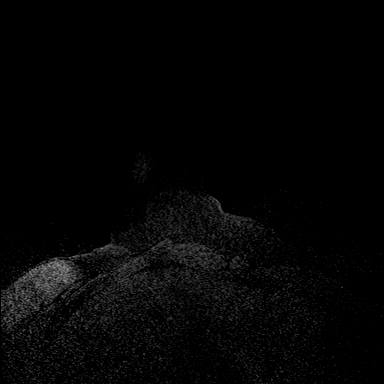
[im 29/144]
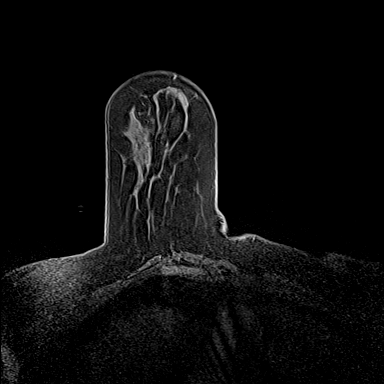
[im 58/144]
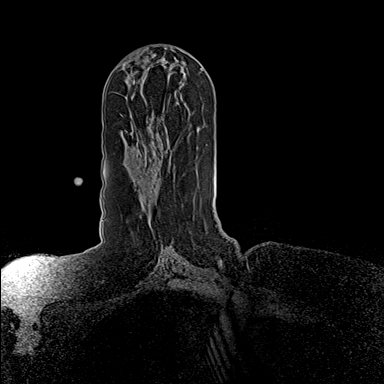
[im 86/144]
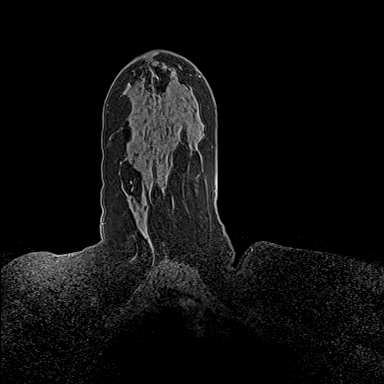
[im 115/144]
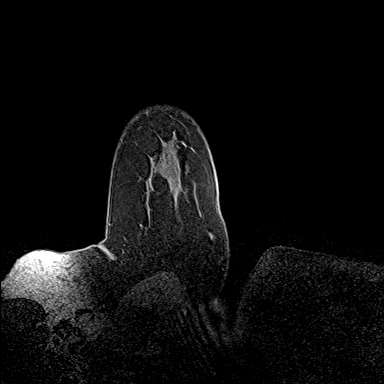
[im 144/144]
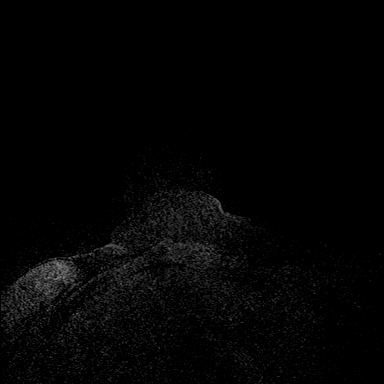

[Series 4: axial post 20 · axial · 1.3mm · 0.73mm/px · z∈[-77,+109]mm · 6 of 144 slices shown (1 of 2)]
[im 1/144]
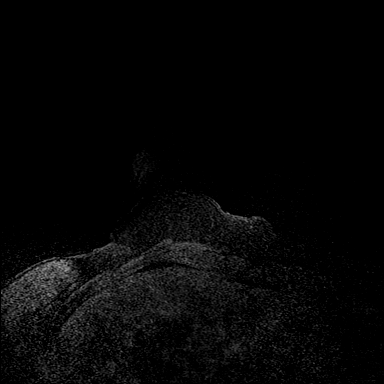
[im 29/144]
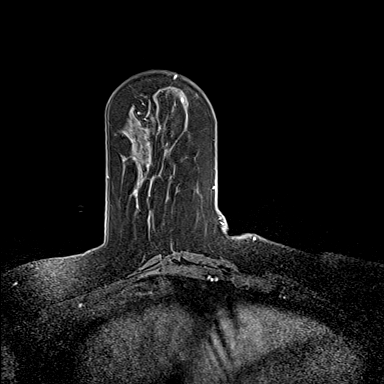
[im 58/144]
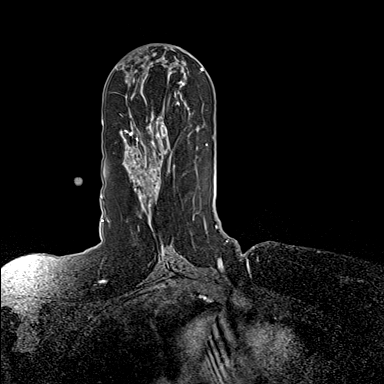
[im 86/144]
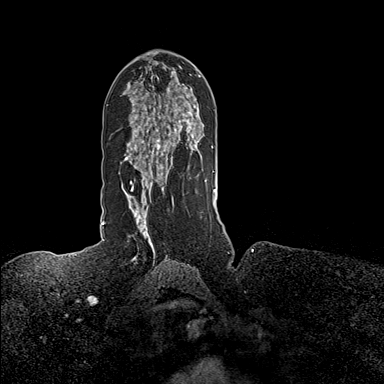
[im 115/144]
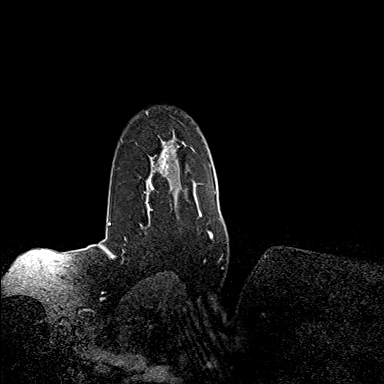
[im 144/144]
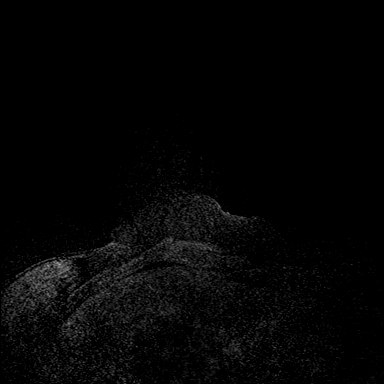

[Series 5: axial post 20 · axial · 1.3mm · 0.73mm/px · z∈[-77,+109]mm · 6 of 144 slices shown (2 of 2)]
[im 1/144]
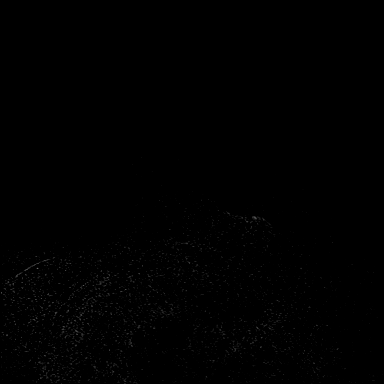
[im 29/144]
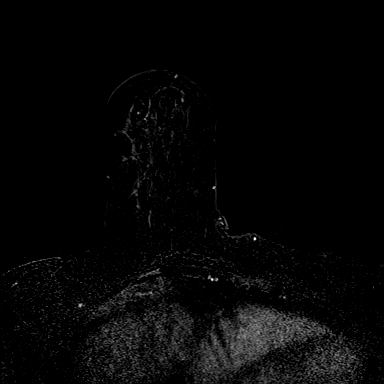
[im 58/144]
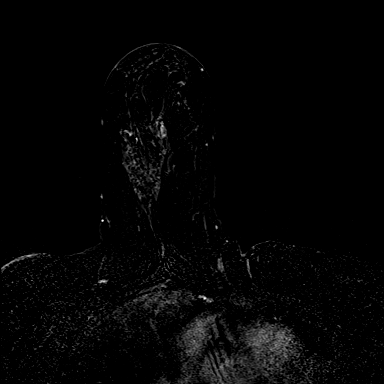
[im 86/144]
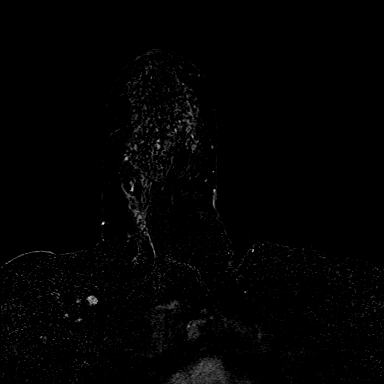
[im 115/144]
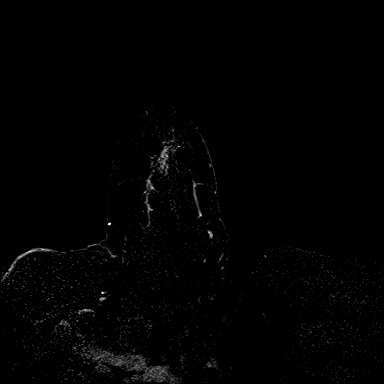
[im 144/144]
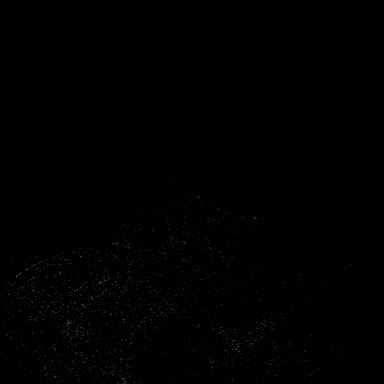

[Series 7: axial confirmation · axial · 1.3mm · 0.73mm/px · z∈[-77,+109]mm · 5 of 144 slices shown]
[im 1/144]
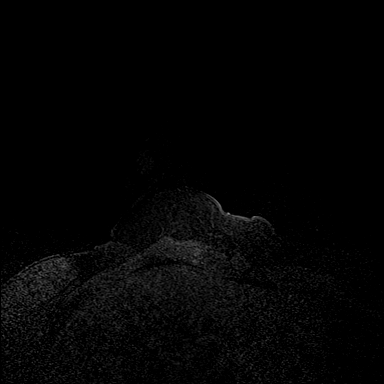
[im 36/144]
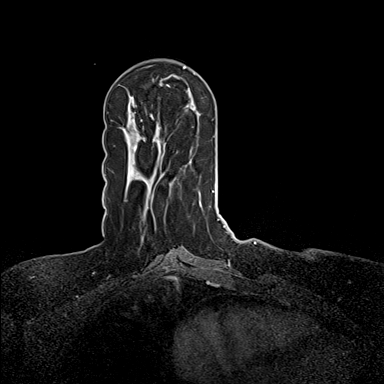
[im 72/144]
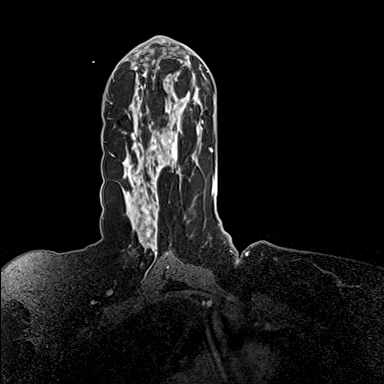
[im 108/144]
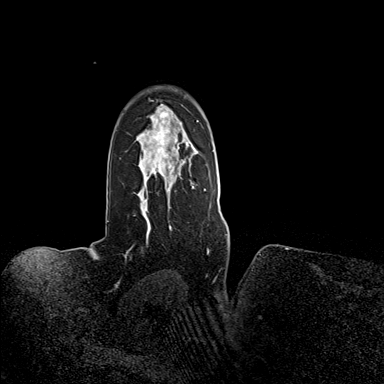
[im 144/144]
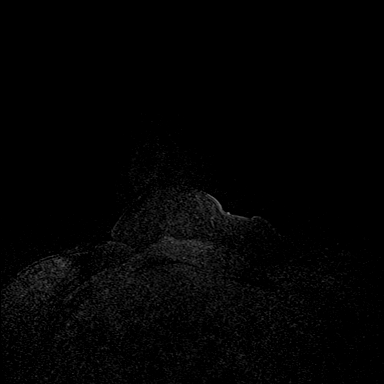

[Series 8: axial confirmation_sub · axial · 1.3mm · 0.73mm/px · z∈[-77,+109]mm · 5 of 144 slices shown]
[im 1/144]
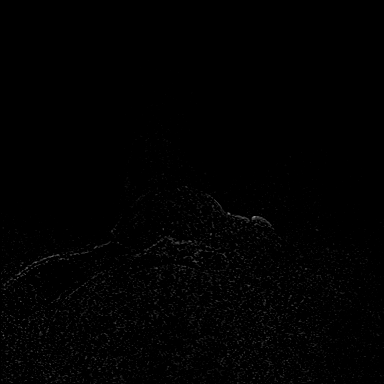
[im 36/144]
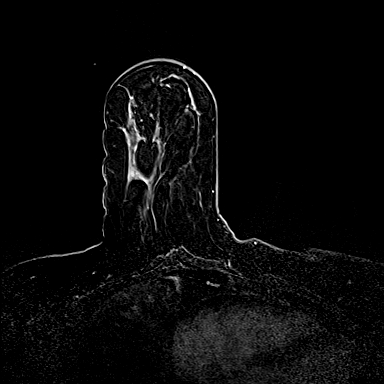
[im 72/144]
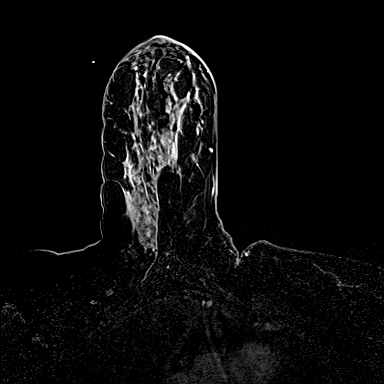
[im 108/144]
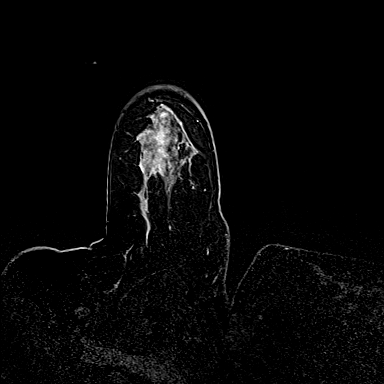
[im 144/144]
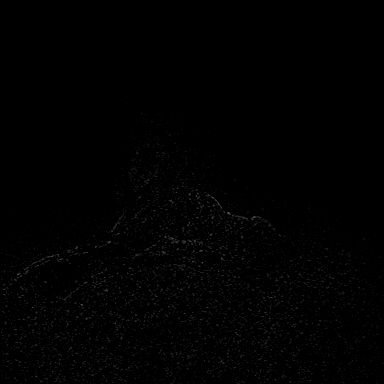

[Series 9: axial post clip · axial · 1.3mm · 0.73mm/px · z∈[-77,+109]mm · 5 of 144 slices shown]
[im 1/144]
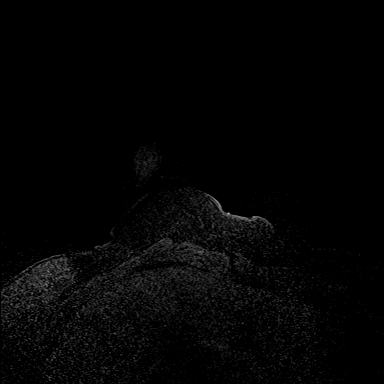
[im 36/144]
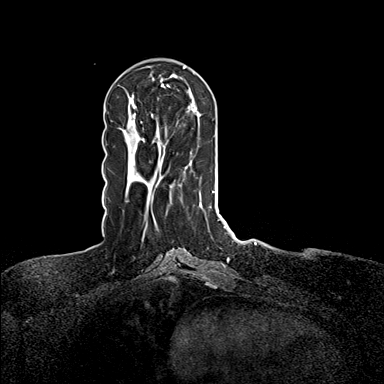
[im 72/144]
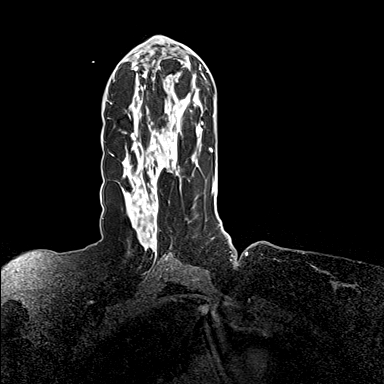
[im 108/144]
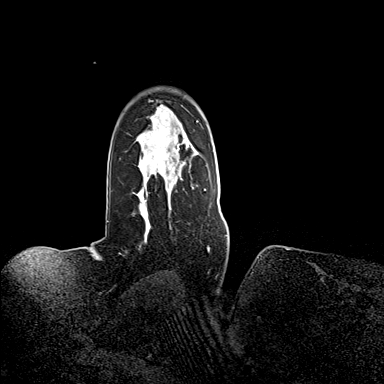
[im 144/144]
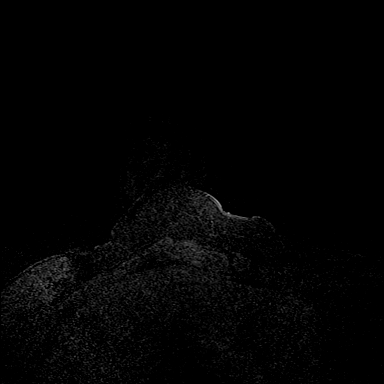

[33 of 48 positions shown; findings below may reference images not displayed]

FINDINGS: I met with the patient, and we discussed the procedure of MRI guided
biopsy, including risks, benefits, and alternatives. Specifically,
we discussed the risks of infection, bleeding, tissue injury, clip
migration, and inadequate sampling. Informed, written consent was
given. The usual time out protocol was performed immediately prior
to the procedure.

Using sterile technique, 2% Lidocaine, MRI guidance, and a 9 gauge
vacuum assisted device, biopsy was performed of the right breast
area of non mass enhancement using a lateral approach. At the
conclusion of the procedure, a hourglass shaped tissue marker clip
was deployed into the biopsy cavity. Follow-up 2-view mammogram was
performed and dictated separately.
IMPRESSION: MRI guided biopsy of right breast. No apparent complications.

## 2016-03-21 NOTE — Telephone Encounter (Signed)
Called patient to schedule for CPAP but she hasn't rec'd her results.

## 2016-03-21 NOTE — Telephone Encounter (Signed)
-----   Message from Melvyn Novasarmen Dohmeier, MD sent at 03/14/2016  3:57 PM EST ----- I recommend CPAP use, based on a mild OSA with strong exacerbation in REM sleep, and the clinical symptoms of loud snoring, fragmented sleep and EDS/ fatigue.  Please call and explain that she is to return for CPAP therapy. CD

## 2016-03-21 NOTE — Telephone Encounter (Signed)
Lm for patient on results of sleep study. She will be contacted by Loetta Roughawn Steen to schedule cpap titration.

## 2016-04-23 ENCOUNTER — Ambulatory Visit (INDEPENDENT_AMBULATORY_CARE_PROVIDER_SITE_OTHER): Payer: Medicaid Other | Admitting: Neurology

## 2016-04-23 DIAGNOSIS — G4733 Obstructive sleep apnea (adult) (pediatric): Secondary | ICD-10-CM | POA: Diagnosis not present

## 2016-04-23 DIAGNOSIS — G4719 Other hypersomnia: Secondary | ICD-10-CM

## 2016-04-24 ENCOUNTER — Telehealth: Payer: Self-pay | Admitting: Neurology

## 2016-04-24 DIAGNOSIS — G4733 Obstructive sleep apnea (adult) (pediatric): Secondary | ICD-10-CM

## 2016-04-24 NOTE — Procedures (Signed)
PATIENT'S NAME:  [name], [name] DOB:      [birth year]      MR#:    [id number]     DATE OF RECORDING: 04/23/2016 REFERRING M.D.:  Carolin Coy, MD Study Performed:   CPAP  Titration HISTORY:  Mrs. Thien presents for a CPAP titration following a PSG at [facility] from 03-03-2017, in which she was diagnosed with OSA at an AHI of 6.3 , REM AHI of 21. 6/ hr.  and SpO2 nadir of 85%. Thunderous snoring recorded. Fragmented sleep. Chronic insomnia in a chronic  Ambien user.  The patient endorsed the Epworth Sleepiness Scale at 13/24 points.   BMI of 32.8 kg/m2. The patient's neck circumference measured 15 inches.  CURRENT MEDICATIONS: Alprazolam, Atorvastatin, Escitalopram, Fluticasone, Hydrocodone, Levothyroxine, Methocarbamol, Propranolol, Ranitidine, Valacyclovir and Zolpidem  PROCEDURE:  This is a multichannel digital polysomnogram utilizing the SomnoStar 11.2 system.  Electrodes and sensors were applied and monitored per AASM Specifications.   EEG, EOG, Chin and Limb EMG, were sampled at 200 Hz.  ECG, Snore and Nasal Pressure, Thermal Airflow, Respiratory Effort, CPAP Flow and Pressure, Oximetry was sampled at 50 Hz. Digital video and audio were recorded.      CPAP was initiated at 5  cmH20 with heated humidity per AASM split night standards and pressure was advanced to 9/9 cmH20 because of hypopneas, apneas and desaturations.  At a PAP pressure of 7 cmH20, there was a reduction of the AHI to 1.2 , but higher pressures created higher AHIs.   Lights Out was at 22:46 and Lights On at 05:03. Total recording time (TRT) was 377.5 minutes, with a total sleep time (TST) of 309 minutes. The patient's sleep latency was 43 minutes with 0 minutes of wake time after sleep onset. REM latency was 145.5 minutes.  The sleep efficiency was 81.9 %.    SLEEP ARCHITECTURE: WASO (Wake after sleep onset) was 41.5 minutes.  There were 19.5 minutes in Stage N1, 250 minutes Stage N2, 0 minutes Stage N3 and 39.5 minutes in Stage REM.  The  percentage of Stage N1 was 6.3%, Stage N2 was 80.9%, Stage N3 was 0% and Stage R (REM sleep) was 12.8%.   RESPIRATORY ANALYSIS:  There were 12 respiratory events: 0 apneas and 12 hypopneas with 0 respiratory event related arousals (RERAs).     The total APNEA/HYPOPNEA INDEX (AHI) was 2.3 /hour and the total RESPIRATORY DISTURBANCE INDEX was 2.3/ hr.  3 events occurred in REM sleep and 9 events in NREM. The REM AHI was 4.6 /hour versus a non-REM AHI of 2.1/hr.  The patient spent 87.5 minutes of total sleep time in the supine position and 222 minutes in non-supine. The supine AHI was 2.1, versus a non-supine AHI of 2.4/hr.  OXYGEN SATURATION & C02:  The baseline 02 saturation was 90%, with the lowest being 79%. Time spent below 89% saturation equaled 74 minutes.  PERIODIC LIMB MOVEMENTS:   The patient had a total of 0 Periodic Limb Movements.     DIAGNOSIS : Mild, REM dependent OSA with limited response to CPAP. The patient was fitted with a Resmed AirFit P10 (XS).   Best tolerated CPAP pressure was at 7 cm water, higher pressures created higher AHIs.  There was evidence of hypoxemia, the O2 nadir was 87% at 7 cm water pressure. Recommend a trial period of 30 days on CPAP of 7 cm water with 2 cm EPR, Airfit P 10 XS , and heated humidity.  ONO on CPAP to be ordered.  No other physiological sleep disorder identified. Sleep was fragmented, but included REM sleep.    A follow up appointment [facility].   Please call [phone] with any questions.      I certify that I have reviewed the entire raw data recording prior to the issuance of this report in accordance with the Standards of Accreditation of the American Academy of Sleep Medicine (AASM)      Melvyn Novasarmen Loetta Connelley, M.D.  04-24-2016  Diplomat, American Board of Psychiatry and Neurology  Diplomat, American Board of Sleep Medicine Medical Director, [facility]

## 2016-04-24 NOTE — Telephone Encounter (Signed)
PATIENT'S NAME:  [name], [name] DOB:      [birth year]      MR#:    [id number]     DATE OF RECORDING: 04/23/2016 REFERRING M.D.:  Carolin Coy, MD Study Performed:   CPAP  Titration HISTORY:  [name] presents for a CPAP titration following a PSG at [facility] from 03-03-2017, in which she was diagnosed with OSA at an AHI of 6.3 , REM AHI of 21.6/hr.  and SpO2 nadir of 85%. Thunderous snoring recorded. Fragmented sleep. Chronic insomnia in a chronic  Ambien user.  The patient endorsed the Epworth Sleepiness Scale at 13/24 points.   BMI of 32.8 kg/m2. The patient's neck circumference measured 15 inches.  CURRENT MEDICATIONS: Alprazolam, Atorvastatin, Escitalopram, Fluticasone, Hydrocodone, Levothyroxine, Methocarbamol, Propranolol, Ranitidine, Valacyclovir and Zolpidem  PROCEDURE:  This is a multichannel digital polysomnogram utilizing the SomnoStar 11.2 system.  Electrodes and sensors were applied and monitored per AASM Specifications.   EEG, EOG, Chin and Limb EMG, were sampled at 200 Hz.  ECG, Snore and Nasal Pressure, Thermal Airflow, Respiratory Effort, CPAP Flow and Pressure, Oximetry was sampled at 50 Hz. Digital video and audio were recorded.      CPAP was initiated at 5  cmH20 with heated humidity per AASM split night standards and pressure was advanced to 9/9 cmH20 because of hypopneas, apneas and desaturations.  At a PAP pressure of 7 cmH20, there was a reduction of the AHI to 1.2 , but higher pressures created higher AHIs.   Lights Out was at 22:46 and Lights On at 05:03. Total recording time (TRT) was 377.5 minutes, with a total sleep time (TST) of 309 minutes. The patient's sleep latency was 43 minutes with 0 minutes of wake time after sleep onset. REM latency was 145.5 minutes.  The sleep efficiency was 81.9 %.    SLEEP ARCHITECTURE: WASO (Wake after sleep onset) was 41.5 minutes.  There were 19.5 minutes in Stage N1, 250 minutes Stage N2, 0 minutes Stage N3 and 39.5 minutes in Stage REM.  The  percentage of Stage N1 was 6.3%, Stage N2 was 80.9%, Stage N3 was 0% and Stage R (REM sleep) was 12.8%.   RESPIRATORY ANALYSIS:  There were 12 respiratory events: 0 apneas and 12 hypopneas with 0 respiratory event related arousals (RERAs).     The total APNEA/HYPOPNEA INDEX (AHI) was 2.3 /hour and the total RESPIRATORY DISTURBANCE INDEX was 2.3/ hr.  3 events occurred in REM sleep and 9 events in NREM. The REM AHI was 4.6 /hour versus a non-REM AHI of 2.1/hr.  The patient spent 87.5 minutes of total sleep time in the supine position and 222 minutes in non-supine. The supine AHI was 2.1, versus a non-supine AHI of 2.4/hr.  OXYGEN SATURATION & C02:  The baseline 02 saturation was 90%, with the lowest being 79%. Time spent below 89% saturation equaled 74 minutes.  PERIODIC LIMB MOVEMENTS:   The patient had a total of 0 Periodic Limb Movements.     DIAGNOSIS : Mild, REM dependent OSA with limited response to CPAP. The patient was fitted with a Resmed AirFit P10 (XS).   Best tolerated CPAP pressure was at 7 cm water, higher pressures created higher AHIs.  There was evidence of hypoxemia, the O2 nadir was 87% at 7 cm water pressure. Recommend a trial period of 30 days on CPAP of 7 cm water with 2 cm EPR, Airfit P 10 XS , and heated humidity.  ONO on CPAP to be ordered.  No  other physiological sleep disorder identified. Sleep was fragmented, but included REM sleep.    A follow up appointment [facility].   Please call [phone] with any questions.      I certify that I have reviewed the entire raw data recording prior to the issuance of this report in accordance with the Standards of Accreditation of the American Academy of Sleep Medicine (AASM)      Melvyn Novasarmen Damont Balles, M.D.  04-24-2016  Diplomat, American Board of Psychiatry and Neurology  Diplomat, American Board of Sleep Medicine Medical Director, [facility]

## 2016-04-25 NOTE — Telephone Encounter (Signed)
See telephone note from 04/25/2016.

## 2016-04-25 NOTE — Telephone Encounter (Addendum)
I called pt. I advised her that her sleep study showed mild, REM dependent sleep apnea with a limited response to CPAP. There was evidence of hypoxemia. Dr. Vickey Hugerohmeier recommends a trial of 30 days on cpap with an ONO as well. Pt's sleep was fragmented, but included REM sleep. Pt is agreeable to this. I advised her that I would send an order to a DME, [facility], and they will call pt within a week to get set up. I reviewed cpap compliance expectations with the pt. Pt was agreeable to a follow up appt on 06/25/16 at 11:00am with Dr. Vickey Hugerohmeier. Pt verbalized understanding of results. Pt had no questions at this time but was encouraged to call back if questions arise.  Pt is also asking about a medication that was discussed in the office visit to help keep her awake but was never prescribed and if this was something that Dr.Dohmeier was going to do know that she has had her sleep study?

## 2016-04-26 ENCOUNTER — Telehealth: Payer: Self-pay | Admitting: *Deleted

## 2016-04-26 MED ORDER — AMPHETAMINE-DEXTROAMPHETAMINE 10 MG PO TABS: 10.0000 mg | ORAL_TABLET | Freq: Every day | ORAL | 0 refills | Status: DC

## 2016-04-26 NOTE — Telephone Encounter (Signed)
   Hello Kara CruiseKristin, To help with daytime alertness at Epworth 13/24 and currently untreated OSA, I have given her 10 mg adderall in AM. Should this make her jittery, anxious or insomnic, I will change it to  Modafinil. ( Plan B ) . We will meet at the upcoming appointment to see if CPAP makes the difference in  EDS  [facility]armen Teauna Dubach. MD

## 2016-04-26 NOTE — Telephone Encounter (Signed)
LMVM for pt on mobile (ok per DPR) that adderall prescription ready for pick up at front desk.  Today close at 1200 Friday.

## 2016-04-29 NOTE — Addendum Note (Signed)
Addended by: Melvyn NovasHMEIER, Haakon Titsworth on: 04/29/2016 10:12 AM   Modules accepted: Orders

## 2016-04-29 NOTE — Telephone Encounter (Signed)
There was evidence of hypoxemia, the O2 nadir was 87% at 7 cm water pressure. Recommend a trial period of 30 days on CPAP of 7 cm water with 2 cm EPR, Airfit P 10 XS , and heated humidity.  ONO on CPAP to be ordered.  No other physiological sleep disorder identified. Sleep was fragmented, but included REM sleep  .I will not set machine to 7 cm water, but use auto 5-10,  Mask as named in sleep study. ONO during CPAP trial of 30 days

## 2016-04-29 NOTE — Telephone Encounter (Signed)
The orders for cpap and ONO still have not been placed. When I receive these orders, I will send the cpap referral to [facility].

## 2016-04-29 NOTE — Telephone Encounter (Signed)
Referral for cpap sent to [facility]. 

## 2016-05-17 ENCOUNTER — Encounter: Payer: Self-pay | Admitting: Neurology

## 2016-06-25 ENCOUNTER — Ambulatory Visit: Payer: Self-pay | Admitting: Neurology

## 2016-10-01 ENCOUNTER — Other Ambulatory Visit: Payer: Self-pay | Admitting: Obstetrics and Gynecology

## 2016-10-01 DIAGNOSIS — R928 Other abnormal and inconclusive findings on diagnostic imaging of breast: Secondary | ICD-10-CM

## 2016-10-08 ENCOUNTER — Ambulatory Visit
Admission: RE | Admit: 2016-10-08 | Discharge: 2016-10-08 | Disposition: A | Discharge status: Home / Self Care | Payer: Medicaid Other | Source: Ambulatory Visit | Attending: Obstetrics and Gynecology | Admitting: Obstetrics and Gynecology

## 2016-10-08 ENCOUNTER — Other Ambulatory Visit: Payer: Self-pay | Admitting: Obstetrics and Gynecology

## 2016-10-08 ENCOUNTER — Ambulatory Visit: Payer: Medicaid Other

## 2016-10-08 DIAGNOSIS — N6489 Other specified disorders of breast: Secondary | ICD-10-CM

## 2016-10-08 DIAGNOSIS — R928 Other abnormal and inconclusive findings on diagnostic imaging of breast: Secondary | ICD-10-CM

## 2017-04-08 DIAGNOSIS — Z6832 Body mass index (BMI) 32.0-32.9, adult: Secondary | ICD-10-CM | POA: Diagnosis not present

## 2017-04-08 DIAGNOSIS — M67962 Unspecified disorder of synovium and tendon, left lower leg: Secondary | ICD-10-CM | POA: Diagnosis not present

## 2017-04-08 DIAGNOSIS — E7849 Other hyperlipidemia: Secondary | ICD-10-CM | POA: Diagnosis not present

## 2017-04-08 DIAGNOSIS — Z23 Encounter for immunization: Secondary | ICD-10-CM | POA: Diagnosis not present

## 2017-04-11 ENCOUNTER — Other Ambulatory Visit: Payer: Medicaid Other

## 2017-04-24 DIAGNOSIS — G43909 Migraine, unspecified, not intractable, without status migrainosus: Secondary | ICD-10-CM | POA: Diagnosis not present

## 2017-04-24 DIAGNOSIS — Z6832 Body mass index (BMI) 32.0-32.9, adult: Secondary | ICD-10-CM | POA: Diagnosis not present

## 2017-04-28 ENCOUNTER — Telehealth: Payer: Self-pay | Admitting: Nurse Practitioner

## 2017-04-28 NOTE — Telephone Encounter (Unsigned)
Copied from CRM [id number]. Topic: Referral - Status >> Apr 28, 2017  4:46 PM Raquel SarnaHayes, Teresa G wrote: Nancie Neasllison of [facility] - [phone]  Checking on a Jan 18 referral that was sent for pt.   Please call back to confirm referral

## 2017-04-28 NOTE — Telephone Encounter (Signed)
Not a patient of our office,

## 2017-05-02 DIAGNOSIS — B373 Candidiasis of vulva and vagina: Secondary | ICD-10-CM | POA: Diagnosis not present

## 2017-05-06 DIAGNOSIS — R05 Cough: Secondary | ICD-10-CM | POA: Diagnosis not present

## 2017-05-06 DIAGNOSIS — G43909 Migraine, unspecified, not intractable, without status migrainosus: Secondary | ICD-10-CM | POA: Diagnosis not present

## 2017-05-06 DIAGNOSIS — J209 Acute bronchitis, unspecified: Secondary | ICD-10-CM | POA: Diagnosis not present

## 2017-05-21 NOTE — Progress Notes (Signed)
[name] [address] [place], [place] [address] Phone: [phone] Subjective:    I'm seeing this patient by the request  of:  Martha Clan, MD   CC: left leg pain   [id number]  [name] is a 42 y.o. female coming in with complaint of left ankle pain. Was moving when she steeped off of something when she heard her ankle pop. Swelling. TTP laterally. Foot goes numb sometimes. Feels pain in proximal fib. As well as fib. Was a gymnast and Horticulturist, commercial.  Onset- August 15th Location- talus, lateral ankle, deformity dist Duration- Afternoon and night bothers her the most Character- Throbs Aggravating factors- Dorsiflexion, Inversion, walking, propping it up, driving Reliving factors-  Therapies tried- Brace, Ice, topicals, orals Severity-7 out of 10 with certain movements     Past Medical History:  Diagnosis Date  . Adverse effect of intravenous anesthetic    Versed  . Astigmatism    mild  . Chronic back pain greater than 3 months duration   . Complication of anesthesia   . Cough 12/24/2011  . Dysrhythmia    tachycardia controlled with med  . GERD (gastroesophageal reflux disease)   . Headache(784.0)    migraines  . High cholesterol   . Hypothyroidism   . PONV (postoperative nausea and vomiting)   . Postural orthostatic tachycardia syndrome   . Snores   . 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   Past Surgical History:  Procedure Laterality Date  . CARDIAC CATHETERIZATION  2008   normal  . CESAREAN SECTION  05/03/2003; 02/06/2006  . DILATION AND CURETTAGE OF UTERUS  05/01/2009  . INTRAUTERINE DEVICE INSERTION  05/01/2009  . IUD REMOVAL  05/01/2009  . LAPAROSCOPIC LYSIS OF ADHESIONS N/A 07/08/2013   Procedure: LAPAROSCOPIC LYSIS OF ADHESIONS;  Surgeon: Jeani Hawking, MD;  Location: [facility];  Service: Gynecology;  Laterality: N/A;  . LAPAROSCOPIC TUBAL LIGATION Bilateral 07/08/2013   Procedure:  LAPAROSCOPIC TUBAL LIGATION;  Surgeon: Jeani Hawking, MD;  Location: [facility];  Service: Gynecology;  Laterality: Bilateral;  . WRIST ARTHROSCOPY  12/31/2011   Procedure: ARTHROSCOPY WRIST;  Surgeon: Wyn Forster., MD;  Location: [facility];  Service: Orthopedics;  Laterality: Left;  Open triangular fibrocartilage complex repair   Social History   Socioeconomic History  . Marital status: Married    Spouse name: None  . Number of children: None  . Years of education: None  . Highest education level: None  Social Needs  . Financial resource strain: None  . Food insecurity - worry: None  . Food insecurity - inability: None  . Transportation needs - medical: None  . Transportation needs - non-medical: None  Occupational History  . None  Tobacco Use  . Smoking status: Current Every Day Smoker    Packs/day: 0.25    Years: 22.00    Pack years: 5.50    Types: E-cigarettes  . Smokeless tobacco: Never Used  . Tobacco comment: pt cutting back on her own  Substance and Sexual Activity  . Alcohol use: Yes    Comment: rarely  . Drug use: No  . Sexual activity: Yes    Birth control/protection: Surgical  Other Topics Concern  . None  Social History Narrative  . None   Allergies  Allergen Reactions  . Decongest-Aid [Pseudoephedrine] Other (See Comments)    TACHYCARDIA  . Prednisone Other (See Comments)    TACHYCARDIA  . Versed [  Midazolam] Other (See Comments)    SEVERE ANGER REACTION   Family History  Problem Relation Age of Onset  . Breast cancer Mother   . Diabetes Mother   . Hypertension Mother   . Hypertension Father   . Breast cancer Maternal Aunt   . Diabetes Paternal Aunt   . Hypertension Maternal Grandmother   . Hypertension Maternal Grandfather   . Hypertension Paternal Grandmother   . Diabetes Paternal Grandfather   . Hypertension Paternal Grandfather      Past medical history, social, surgical and family history all reviewed in  electronic medical record.  No pertanent information unless stated regarding to the chief complaint.   Review of Systems:Review of systems updated and as accurate as of 05/22/17  No headache, visual changes, nausea, vomiting, diarrhea, constipation, dizziness, abdominal pain, skin rash, fevers, chills, night sweats, weight loss, swollen lymph nodes, body aches, joint swelling, muscle aches, chest pain, shortness of breath, mood changes.   Objective  Blood pressure 130/84, pulse 82, height 5' 2.5" (1.588 m), weight 189 lb (85.7 kg), last menstrual period 05/19/2017, SpO2 98 %. Systems examined below as of 05/22/17   General: No apparent distress alert and oriented x3 mood and affect normal, dressed appropriately.  HEENT: Pupils equal, extraocular movements intact  Respiratory: Patient's speak in full sentences and does not appear short of breath  Cardiovascular: No lower extremity edema, non tender, no erythema  Skin: Warm dry intact with no signs of infection or rash on extremities or on axial skeleton.  Abdomen: Soft nontender  Neuro: Cranial nerves II through XII are intact, neurovascularly intact in all extremities with 2+ DTRs and 2+ pulses.  Lymph: No lymphadenopathy of posterior or anterior cervical chain or axillae bilaterally.  Gait normal with good balance and coordination.  MSK:  Non tender with full range of motion and good stability and symmetric strength and tone of shoulders, elbows, wrist, hip, knee and bilaterally.  Ankle: Left Trace swelling on the lateral aspect of the ankle Range of motion is full in all directions. Strength is 4/5 in all directions but symmetric. Mild laxity of the ATFL. Talar dome moderately tender on the lateral aspect No pain at base of 5th MT; No tenderness over cuboid; No tenderness over N spot or navicular prominence No tenderness on posterior aspects of lateral and medial malleolus Pain over the peroneal tendons and the peroneal muscle  itself Negative tarsal tunnel tinel's Able to walk 4 steps.  Contralateral ankle unremarkable  MSK US performed of: Left ankle This study was ordered, performed, and interpreted by Terrilee FilesZach Merica Prell D.O.  Foot/Ankle:   All structures visualized.   Talar dome trace swelling laterally Ankle mortise without effusion.  Possible cyst formation noted Peroneus longus and brevis tendons mild hypoechoic changes but patient does have calcific changes of the ATFL Posterior tibialis, flexor hallucis longus, and flexor digitorum longus tendons unremarkable on long and transverse views without sheath effusions. Achilles tendon visualized along length of tendon and unremarkable on long and transverse views without sheath effusion. Deltoid Ligament unremarkable and intact. Plantar fascia intact and without effusion, normal thickness. No increased doppler signal, cap sign, or thickening of tibial cortex. Power doppler signal normal.  IMPRESSION: Calcification of the ATFL, peroneal tendinitis, possible cyst in the ankle mortise        Impression and Recommendations:     This case required medical decision making of moderate complexity.      Note: This dictation was prepared with Dragon dictation along with  smaller phrase technology. Any transcriptional errors that result from this process are unintentional.

## 2017-05-22 ENCOUNTER — Encounter: Payer: Self-pay | Admitting: Family Medicine

## 2017-05-22 ENCOUNTER — Ambulatory Visit (INDEPENDENT_AMBULATORY_CARE_PROVIDER_SITE_OTHER)
Admission: RE | Admit: 2017-05-22 | Discharge: 2017-05-22 | Disposition: A | Discharge status: Home / Self Care | Payer: BLUE CROSS/BLUE SHIELD | Source: Ambulatory Visit | Attending: Family Medicine | Admitting: Family Medicine

## 2017-05-22 ENCOUNTER — Ambulatory Visit: Payer: BLUE CROSS/BLUE SHIELD | Admitting: Family Medicine

## 2017-05-22 ENCOUNTER — Ambulatory Visit: Payer: Self-pay

## 2017-05-22 VITALS — BP 130/84 | HR 82 | Ht 62.5 in | Wt 189.0 lb

## 2017-05-22 DIAGNOSIS — M25572 Pain in left ankle and joints of left foot: Secondary | ICD-10-CM

## 2017-05-22 DIAGNOSIS — G8929 Other chronic pain: Secondary | ICD-10-CM

## 2017-05-22 DIAGNOSIS — S99912A Unspecified injury of left ankle, initial encounter: Secondary | ICD-10-CM | POA: Diagnosis not present

## 2017-05-22 MED ORDER — GABAPENTIN 100 MG PO CAPS: 200.0000 mg | ORAL_CAPSULE | Freq: Every day | ORAL | 3 refills | Status: DC

## 2017-05-22 MED ORDER — VITAMIN D (ERGOCALCIFEROL) 1.25 MG (50000 UNIT) PO CAPS: 50000.0000 [IU] | ORAL_CAPSULE | ORAL | 0 refills | Status: DC

## 2017-05-22 NOTE — Patient Instructions (Signed)
Good to see you  Ice 20 minutes 2 times daily. Usually after activity and before bed. Exercises 3 times a week.  Good shoes with rigid bottom.  Dierdre HarnessKeen, Dansko, Merrell or New balance greater then 700 Spenco orthotics "total support" online would be great  Avoid being barefoot Once weekly vitamin D for 12 weeks.  Gabapentin 200mg  at night See me again in 3-4 weeks and lets see if we need to pop the cyst

## 2017-05-22 NOTE — Assessment & Plan Note (Signed)
Calcific changes of the ATFL that I think is compressing this is contributing to most of her aching and pain.  We discussed the possibility of injection.  Also a possible intra-articular cyst was noted on ultrasound.  X-rays pending.  Symptoms to seek medical attention immediately.  I do not believe that there is any true fracture noted.  Patient will try the medications that were given to her today including the gabapentin and warned of potential side effects.  Follow-up again in 4 weeks

## 2017-05-26 ENCOUNTER — Ambulatory Visit
Admission: RE | Admit: 2017-05-26 | Discharge: 2017-05-26 | Disposition: A | Discharge status: Home / Self Care | Payer: Self-pay | Source: Ambulatory Visit | Attending: Obstetrics and Gynecology | Admitting: Obstetrics and Gynecology

## 2017-05-26 DIAGNOSIS — R928 Other abnormal and inconclusive findings on diagnostic imaging of breast: Secondary | ICD-10-CM | POA: Diagnosis not present

## 2017-05-26 DIAGNOSIS — N6489 Other specified disorders of breast: Secondary | ICD-10-CM

## 2017-06-11 NOTE — Progress Notes (Signed)
[facility]ana Scale[facility]ach Acosta D.O. [facility] [address] [place], [place] [address] Phone: [phone] Subjective:     CC: Ankle pain follow-up  ZDG:[id number]:Subjective  [name] is a 42 y.o. female coming in with complaint of ankle pain.  Found to have an ankle effusion with calcific changes of the ATFL.  Patient was inducible exercises from peroneal tendinitis, icing regimen and home exercises.  Patient states has noticed less swelling, but not as much pain.  Still having no discomfort on the lateral aspect of the ankle.  Patient did have x-rays at last exam showing no significant bony normality.  This was independently visualized by me.  Pain cells noted on the lateral aspect of the ankle.     Past Medical History:  Diagnosis Date  . Adverse effect of intravenous anesthetic    Versed  . Astigmatism    mild  . Chronic back pain greater than 3 months duration   . Complication of anesthesia   . Cough 12/24/2011  . Dysrhythmia    tachycardia controlled with med  . GERD (gastroesophageal reflux disease)   . Headache(784.0)    migraines  . High cholesterol   . Hypothyroidism   . PONV (postoperative nausea and vomiting)   . Postural orthostatic tachycardia syndrome   . Snores   . 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   Past Surgical History:  Procedure Laterality Date  . BREAST BIOPSY    . CARDIAC CATHETERIZATION  2008   normal  . CESAREAN SECTION  05/03/2003; 02/06/2006  . DILATION AND CURETTAGE OF UTERUS  05/01/2009  . INTRAUTERINE DEVICE INSERTION  05/01/2009  . IUD REMOVAL  05/01/2009  . LAPAROSCOPIC LYSIS OF ADHESIONS N/A 07/08/2013   Procedure: LAPAROSCOPIC LYSIS OF ADHESIONS;  Surgeon: Jeani HawkingMichelle L Grewal, MD;  Location: [facility];  Service: Gynecology;  Laterality: N/A;  . LAPAROSCOPIC TUBAL LIGATION Bilateral 07/08/2013   Procedure: LAPAROSCOPIC TUBAL LIGATION;  Surgeon: Jeani HawkingMichelle L Grewal, MD;  Location: [facility];  Service: Gynecology;  Laterality:  Bilateral;  . WRIST ARTHROSCOPY  12/31/2011   Procedure: ARTHROSCOPY WRIST;  Surgeon: Wyn Forsterobert V Sypher Jr., MD;  Location: [facility];  Service: Orthopedics;  Laterality: Left;  Open triangular fibrocartilage complex repair   Social History   Socioeconomic History  . Marital status: Married    Spouse name: Not on file  . Number of children: Not on file  . Years of education: Not on file  . Highest education level: Not on file  Occupational History  . Not on file  Social Needs  . Financial resource strain: Not on file  . Food insecurity:    Worry: Not on file    Inability: Not on file  . Transportation needs:    Medical: Not on file    Non-medical: Not on file  Tobacco Use  . Smoking status: Current Every Day Smoker    Packs/day: 0.25    Years: 22.00    Pack years: 5.50    Types: E-cigarettes  . Smokeless tobacco: Never Used  . Tobacco comment: pt cutting back on her own  Substance and Sexual Activity  . Alcohol use: Yes    Comment: rarely  . Drug use: No  . Sexual activity: Yes    Birth control/protection: Surgical  Lifestyle  . Physical activity:    Days per week: Not on file    Minutes per session: Not on file  . Stress: Not on file  Relationships  .  Social connections:    Talks on phone: Not on file    Gets together: Not on file    Attends religious service: Not on file    Active member of club or organization: Not on file    Attends meetings of clubs or organizations: Not on file    Relationship status: Not on file  Other Topics Concern  . Not on file  Social History Narrative  . Not on file   Allergies  Allergen Reactions  . Decongest-Aid [Pseudoephedrine] Other (See Comments)    TACHYCARDIA  . Prednisone Other (See Comments)    TACHYCARDIA  . Versed [Midazolam] Other (See Comments)    SEVERE ANGER REACTION   Family History  Problem Relation Age of Onset  . Breast cancer Mother   . Diabetes Mother   . Hypertension Mother   .  Hypertension Father   . Breast cancer Maternal Aunt   . Diabetes Paternal Aunt   . Hypertension Maternal Grandmother   . Hypertension Maternal Grandfather   . Hypertension Paternal Grandmother   . Diabetes Paternal Grandfather   . Hypertension Paternal Grandfather      Past medical history, social, surgical and family history all reviewed in electronic medical record.  No pertanent information unless stated regarding to the chief complaint.   Review of Systems:Review of systems updated and as accurate as of 06/12/17  No headache, visual changes, nausea, vomiting, diarrhea, constipation, dizziness, abdominal pain, skin rash, fevers, chills, night sweats, weight loss, swollen lymph nodes, body aches, joint swelling, muscle aches, chest pain, shortness of breath, mood changes.   Objective  Blood pressure 112/72, pulse 79, height 5\' 2"  (1.575 m), weight 189 lb (85.7 kg), last menstrual period 05/19/2017, SpO2 98 %. Systems examined below as of 06/12/17   General: No apparent distress alert and oriented x3 mood and affect normal, dressed appropriately.  HEENT: Pupils equal, extraocular movements intact  Respiratory: Patient's speak in full sentences and does not appear short of breath  Cardiovascular: No lower extremity edema, non tender, no erythema  Skin: Warm dry intact with no signs of infection or rash on extremities or on axial skeleton.  Abdomen: Soft nontender  Neuro: Cranial nerves II through XII are intact, neurovascularly intact in all extremities with 2+ DTRs and 2+ pulses.  Lymph: No lymphadenopathy of posterior or anterior cervical chain or axillae bilaterally.  Gait antalgic MSK:  Non tender with full range of motion and good stability and symmetric strength and tone of shoulders, elbows, wrist, hip, knee bilaterally.  Left ankle exam still shows patient has some tenderness to palpation over the lateral peroneal tendons.  Patient has less effusion than noted from previous  exam.  Still some mild limited range of motion and 4 out of 5 strength with dorsiflexion compared to the contralateral foot.  Minimal pain over the ATFL  Procedure: Real-time Ultrasound Guided Injection of left peroneal tendon sheath Device: GE Logiq Q7 Ultrasound guided injection is preferred based studies that show increased duration, increased effect, greater accuracy, decreased procedural pain, increased response rate, and decreased cost with ultrasound guided versus blind injection.  Verbal informed consent obtained.  Time-out conducted.  Noted no overlying erythema, induration, or other signs of local infection.  Skin prepped in a sterile fashion.  Local anesthesia: Topical Ethyl chloride.  With sterile technique and under real time ultrasound guidance: With a 25-gauge half inch needle patient was injected with a total of 0.5 cc of 0.5% Marcaine and 0.5 cc of Kenalog 40  mg/mL this was in the tendon sheath Completed without difficulty  Pain immediately resolved suggesting accurate placement of the medication.  Advised to call if fevers/chills, erythema, induration, drainage, or persistent bleeding.  Images permanently stored and available for review in the ultrasound unit.  Impression: Technically successful ultrasound guided injection.    Impression and Recommendations:     This case required medical decision making of moderate complexity.      Note: This dictation was prepared with Dragon dictation along with smaller phrase technology. Any transcriptional errors that result from this process are unintentional.

## 2017-06-12 ENCOUNTER — Encounter: Payer: Self-pay | Admitting: Family Medicine

## 2017-06-12 ENCOUNTER — Ambulatory Visit: Payer: Self-pay

## 2017-06-12 ENCOUNTER — Ambulatory Visit: Payer: BLUE CROSS/BLUE SHIELD | Admitting: Family Medicine

## 2017-06-12 VITALS — BP 112/72 | HR 79 | Ht 62.0 in | Wt 189.0 lb

## 2017-06-12 DIAGNOSIS — M25572 Pain in left ankle and joints of left foot: Secondary | ICD-10-CM | POA: Diagnosis not present

## 2017-06-12 DIAGNOSIS — M7672 Peroneal tendinitis, left leg: Secondary | ICD-10-CM | POA: Diagnosis not present

## 2017-06-12 DIAGNOSIS — G8929 Other chronic pain: Secondary | ICD-10-CM

## 2017-06-12 NOTE — Assessment & Plan Note (Signed)
Patient given injection and tolerated the procedure well.  We discussed icing regimen and home exercises.  Discussed which activities to doing which wants to avoid.  Discussed compression again.  Continue the vitamin D.  Continue the gabapentin.  Follow-up again in 4 weeks

## 2017-06-12 NOTE — Patient Instructions (Signed)
Good to see you  [name] is your friend.  Stay active.  Take it easy for 2 days then start the exercises again.  Consider physical therapy  See me again in 4 weeks

## 2017-06-17 DIAGNOSIS — E038 Other specified hypothyroidism: Secondary | ICD-10-CM | POA: Diagnosis not present

## 2017-06-17 DIAGNOSIS — E785 Hyperlipidemia, unspecified: Secondary | ICD-10-CM | POA: Diagnosis not present

## 2017-06-30 DIAGNOSIS — G4733 Obstructive sleep apnea (adult) (pediatric): Secondary | ICD-10-CM | POA: Diagnosis not present

## 2017-06-30 DIAGNOSIS — G43909 Migraine, unspecified, not intractable, without status migrainosus: Secondary | ICD-10-CM | POA: Diagnosis not present

## 2017-06-30 DIAGNOSIS — E038 Other specified hypothyroidism: Secondary | ICD-10-CM | POA: Diagnosis not present

## 2017-06-30 DIAGNOSIS — Z0000 Encounter for general adult medical examination without abnormal findings: Secondary | ICD-10-CM | POA: Diagnosis not present

## 2017-06-30 DIAGNOSIS — E7849 Other hyperlipidemia: Secondary | ICD-10-CM | POA: Diagnosis not present

## 2017-07-13 NOTE — Progress Notes (Signed)
[name] [name] D.O.  [facility] [address] [place], [place] [address] Phone: [phone] Subjective:     CC: Ankle pain follow-up  ZHY:[id number]:Subjective  [name] is a 42 y.o. female coming in with complaint of ankle pain.  Found to have calcific ATFL.  Patient was also found to have peroneal tendinitis.  Given injection June 12, 2017.  She was to continue conservative therapy including home exercises and icing regimen.  Patient states that the injection did help but that it seems to have worn off within the past week. Pain is felt intermittently but at a decreased frequency from last visit. Pain with plantar flexed positions such as driving or pain later in the day after a day of work.     Past Medical History:  Diagnosis Date  . Adverse effect of intravenous anesthetic    Versed  . Astigmatism    mild  . Chronic back pain greater than 3 months duration   . Complication of anesthesia   . Cough 12/24/2011  . Dysrhythmia    tachycardia controlled with med  . GERD (gastroesophageal reflux disease)   . Headache(784.0)    migraines  . High cholesterol   . Hypothyroidism   . PONV (postoperative nausea and vomiting)   . Postural orthostatic tachycardia syndrome   . Snores   . 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   Past Surgical History:  Procedure Laterality Date  . BREAST BIOPSY    . CARDIAC CATHETERIZATION  2008   normal  . CESAREAN SECTION  05/03/2003; 02/06/2006  . DILATION AND CURETTAGE OF UTERUS  05/01/2009  . INTRAUTERINE DEVICE INSERTION  05/01/2009  . IUD REMOVAL  05/01/2009  . LAPAROSCOPIC LYSIS OF ADHESIONS N/A 07/08/2013   Procedure: LAPAROSCOPIC LYSIS OF ADHESIONS;  Surgeon: Jeani HawkingMichelle L Grewal, MD;  Location: [facility];  Service: Gynecology;  Laterality: N/A;  . LAPAROSCOPIC TUBAL LIGATION Bilateral 07/08/2013   Procedure: LAPAROSCOPIC TUBAL LIGATION;  Surgeon: Jeani HawkingMichelle L Grewal, MD;  Location: [facility];  Service: Gynecology;   Laterality: Bilateral;  . WRIST ARTHROSCOPY  12/31/2011   Procedure: ARTHROSCOPY WRIST;  Surgeon: Wyn Forsterobert V Sypher Jr., MD;  Location: [facility];  Service: Orthopedics;  Laterality: Left;  Open triangular fibrocartilage complex repair   Social History   Socioeconomic History  . Marital status: Married    Spouse name: Not on file  . Number of children: Not on file  . Years of education: Not on file  . Highest education level: Not on file  Occupational History  . Not on file  Social Needs  . Financial resource strain: Not on file  . Food insecurity:    Worry: Not on file    Inability: Not on file  . Transportation needs:    Medical: Not on file    Non-medical: Not on file  Tobacco Use  . Smoking status: Current Every Day Smoker    Packs/day: 0.25    Years: 22.00    Pack years: 5.50    Types: E-cigarettes  . Smokeless tobacco: Never Used  . Tobacco comment: pt cutting back on her own  Substance and Sexual Activity  . Alcohol use: Yes    Comment: rarely  . Drug use: No  . Sexual activity: Yes    Birth control/protection: Surgical  Lifestyle  . Physical activity:    Days per week: Not on file    Minutes per session: Not on file  . Stress: Not on  file  Relationships  . Social connections:    Talks on phone: Not on file    Gets together: Not on file    Attends religious service: Not on file    Active member of club or organization: Not on file    Attends meetings of clubs or organizations: Not on file    Relationship status: Not on file  Other Topics Concern  . Not on file  Social History Narrative  . Not on file   Allergies  Allergen Reactions  . Decongest-Aid [Pseudoephedrine] Other (See Comments)    TACHYCARDIA  . Prednisone Other (See Comments)    TACHYCARDIA  . Versed [Midazolam] Other (See Comments)    SEVERE ANGER REACTION   Family History  Problem Relation Age of Onset  . Breast cancer Mother   . Diabetes Mother   . Hypertension Mother    . Hypertension Father   . Breast cancer Maternal Aunt   . Diabetes Paternal Aunt   . Hypertension Maternal Grandmother   . Hypertension Maternal Grandfather   . Hypertension Paternal Grandmother   . Diabetes Paternal Grandfather   . Hypertension Paternal Grandfather      Past medical history, social, surgical and family history all reviewed in electronic medical record.  No pertanent information unless stated regarding to the chief complaint.   Review of Systems:Review of systems updated and as accurate as of 07/14/17  No headache, visual changes, nausea, vomiting, diarrhea, constipation, dizziness, abdominal pain, skin rash, fevers, chills, night sweats, weight loss, swollen lymph nodes, body aches, joint swelling, muscle aches, chest pain, shortness of breath, mood changes.   Objective  Blood pressure 122/70, pulse 86, height 5\' 2"  (1.575 m), weight 189 lb (85.7 kg), SpO2 98 %. Systems examined below as of 07/14/17   General: No apparent distress alert and oriented x3 mood and affect normal, dressed appropriately.  HEENT: Pupils equal, extraocular movements intact  Respiratory: Patient's speak in full sentences and does not appear short of breath  Cardiovascular: No lower extremity edema, non tender, no erythema  Skin: Warm dry intact with no signs of infection or rash on extremities or on axial skeleton.  Abdomen: Soft nontender  Neuro: Cranial nerves II through XII are intact, neurovascularly intact in all extremities with 2+ DTRs and 2+ pulses.  Lymph: No lymphadenopathy of posterior or anterior cervical chain or axillae bilaterally.  Gait normal with good balance and coordination.  MSK:  Non tender with full range of motion and good stability and symmetric strength and tone of shoulders, elbows, wrist, hip, knee bilaterally.  Ankle: Left ankle No visible erythema or swelling. Range of motion is full in all directions. Strength is 5/5 in all directions. Continue mild  positive anterior drawer Very mild tenderness of the talar dome No pain at base of 5th MT; No tenderness over cuboid; No tenderness over N spot or navicular prominence No tenderness on posterior aspects of lateral and medial malleolus Minimal pain over the peroneal tendons Negative tarsal tunnel tinel's Able to walk 4 steps. Contralateral ankle unremarkable Contralateral ankle unremarkable  MSK US performed of: Left ankle This study was ordered, performed, and interpreted by Terrilee Files D.O.  Foot/Ankle:   No joint effusion noted at this time.  Still some mild effusion of the peroneal tendon noted.  Peroneal nerve decrease in size.  Significant decrease in calcific changes of the ATFL from previous exam.  IMPRESSION: Improvement with resolved joint effusion, diameter peroneal nerve, as well as peroneal tendinitis and calcific  changes of the ATFL     Impression and Recommendations:     This case required medical decision making of moderate complexity.      Note: This dictation was prepared with Dragon dictation along with smaller phrase technology. Any transcriptional errors that result from this process are unintentional.

## 2017-07-14 ENCOUNTER — Encounter: Payer: Self-pay | Admitting: Family Medicine

## 2017-07-14 ENCOUNTER — Ambulatory Visit: Payer: Self-pay

## 2017-07-14 ENCOUNTER — Ambulatory Visit: Payer: BLUE CROSS/BLUE SHIELD | Admitting: Family Medicine

## 2017-07-14 VITALS — BP 122/70 | HR 86 | Ht 62.0 in | Wt 189.0 lb

## 2017-07-14 DIAGNOSIS — M25572 Pain in left ankle and joints of left foot: Secondary | ICD-10-CM

## 2017-07-14 DIAGNOSIS — G8929 Other chronic pain: Secondary | ICD-10-CM

## 2017-07-14 MED ORDER — VITAMIN D (ERGOCALCIFEROL) 1.25 MG (50000 UNIT) PO CAPS: 50000.0000 [IU] | ORAL_CAPSULE | ORAL | 0 refills | Status: DC

## 2017-07-14 NOTE — Patient Instructions (Signed)
Good to see you  [name] is your friend.  Thigh compression sleeve for the thigh with a lot of activity  Stay active Ankle looks great  My exercises 1-2 times a week  See me again in 6-8 weeks if not perfect

## 2017-07-14 NOTE — Assessment & Plan Note (Signed)
Improved at this time.  Continue conservative therapy.  Discussed compression with activity.  Discussed if any worsening instability or swelling to come back for potential injection.  Continue vitamin D supplementation which was refilled.

## 2017-09-08 ENCOUNTER — Ambulatory Visit: Payer: BLUE CROSS/BLUE SHIELD | Admitting: Family Medicine

## 2017-09-13 NOTE — Progress Notes (Signed)
[name] [address] [place], [place] [address] Phone: [phone] Subjective:     CC: Back pain  [id number]  [name] is a 42 y.o. female coming in with complaint of low back pain.  Been going on for quite some time.  Seems to be worse when sitting for long amount of time.  Seems to be more irritated in the sitting position.  More pain in the right buttocks.  Radiation goes down the leg.  Posterior midportion tingling.  Denies any weakness.  States that she has to get up and move to make his pain go away.  Denies any fevers chills or any abnormal weight loss.  Does not remember any injury.     Past Medical History:  Diagnosis Date  . Adverse effect of intravenous anesthetic    Versed  . Astigmatism    mild  . Chronic back pain greater than 3 months duration   . Complication of anesthesia   . Cough 12/24/2011  . Dysrhythmia    tachycardia controlled with med  . GERD (gastroesophageal reflux disease)   . Headache(784.0)    migraines  . High cholesterol   . Hypothyroidism   . PONV (postoperative nausea and vomiting)   . Postural orthostatic tachycardia syndrome   . Snores   . 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   Past Surgical History:  Procedure Laterality Date  . BREAST BIOPSY    . CARDIAC CATHETERIZATION  2008   normal  . CESAREAN SECTION  05/03/2003; 02/06/2006  . DILATION AND CURETTAGE OF UTERUS  05/01/2009  . INTRAUTERINE DEVICE INSERTION  05/01/2009  . IUD REMOVAL  05/01/2009  . LAPAROSCOPIC LYSIS OF ADHESIONS N/A 07/08/2013   Procedure: LAPAROSCOPIC LYSIS OF ADHESIONS;  Surgeon: Jeani Hawking, MD;  Location: [facility];  Service: Gynecology;  Laterality: N/A;  . LAPAROSCOPIC TUBAL LIGATION Bilateral 07/08/2013   Procedure: LAPAROSCOPIC TUBAL LIGATION;  Surgeon: Jeani Hawking, MD;  Location: [facility];  Service: Gynecology;  Laterality: Bilateral;  . WRIST ARTHROSCOPY  12/31/2011   Procedure: ARTHROSCOPY WRIST;  Surgeon: Wyn Forster., MD;  Location: [facility];  Service: Orthopedics;  Laterality: Left;  Open triangular fibrocartilage complex repair   Social History   Socioeconomic History  . Marital status: Married    Spouse name: Not on file  . Number of children: Not on file  . Years of education: Not on file  . Highest education level: Not on file  Occupational History  . Not on file  Social Needs  . Financial resource strain: Not on file  . Food insecurity:    Worry: Not on file    Inability: Not on file  . Transportation needs:    Medical: Not on file    Non-medical: Not on file  Tobacco Use  . Smoking status: Current Every Day Smoker    Packs/day: 0.25    Years: 22.00    Pack years: 5.50    Types: E-cigarettes  . Smokeless tobacco: Never Used  . Tobacco comment: pt cutting back on her own  Substance and Sexual Activity  . Alcohol use: Yes    Comment: rarely  . Drug use: No  . Sexual activity: Yes    Birth control/protection: Surgical  Lifestyle  . Physical activity:    Days per week: Not on file    Minutes per session: Not on file  . Stress: Not on file  Relationships  .  Social connections:    Talks on phone: Not on file    Gets together: Not on file    Attends religious service: Not on file    Active member of club or organization: Not on file    Attends meetings of clubs or organizations: Not on file    Relationship status: Not on file  Other Topics Concern  . Not on file  Social History Narrative  . Not on file   Allergies  Allergen Reactions  . Decongest-Aid [Pseudoephedrine] Other (See Comments)    TACHYCARDIA  . Prednisone Other (See Comments)    TACHYCARDIA  . Versed [Midazolam] Other (See Comments)    SEVERE ANGER REACTION   Family History  Problem Relation Age of Onset  . Breast cancer Mother   . Diabetes Mother   . Hypertension Mother   . Hypertension Father   . Breast cancer Maternal Aunt    . Diabetes Paternal Aunt   . Hypertension Maternal Grandmother   . Hypertension Maternal Grandfather   . Hypertension Paternal Grandmother   . Diabetes Paternal Grandfather   . Hypertension Paternal Grandfather      Past medical history, social, surgical and family history all reviewed in electronic medical record.  No pertanent information unless stated regarding to the chief complaint.   Review of Systems:Review of systems updated and as accurate as of 09/15/17  No headache, visual changes, nausea, vomiting, diarrhea, constipation, dizziness, abdominal pain, skin rash, fevers, chills, night sweats, weight loss, swollen lymph nodes, body aches, joint swelling, muscle aches, chest pain, shortness of breath, mood changes.   Objective  Blood pressure 126/82, pulse 88, height 5' (1.524 m), weight 189 lb (85.7 kg), SpO2 98 %. Systems examined below as of 09/15/17   General: No apparent distress alert and oriented x3 mood and affect normal, dressed appropriately.  HEENT: Pupils equal, extraocular movements intact  Respiratory: Patient's speak in full sentences and does not appear short of breath  Cardiovascular: No lower extremity edema, non tender, no erythema  Skin: Warm dry intact with no signs of infection or rash on extremities or on axial skeleton.  Abdomen: Soft nontender  Neuro: Cranial nerves II through XII are intact, neurovascularly intact in all extremities with 2+ DTRs and 2+ pulses.  Lymph: No lymphadenopathy of posterior or anterior cervical chain or axillae bilaterally.  Gait normal with good balance and coordination.  MSK:  Non tender with full range of motion and good stability and symmetric strength and tone of shoulders, elbows, wrist, hip, knee and ankles bilaterally.  Ankle is doing significantly better from previous exam has no instability  Back Exam:  Inspection: Unremarkable  Motion: Flexion 45 deg, Extension 45 deg, Side Bending to 45 deg bilaterally,  Rotation  to 45 deg bilaterally  SLR laying: Negative  XSLR laying: Negative  Palpable tenderness: Tender to palpation the paraspinal musculature lumbar spine right greater than left.  Severe tenderness in the right piriformis FABER: Positive Faber. Sensory change: Gross sensation intact to all lumbar and sacral dermatomes.  Reflexes: 2+ at both patellar tendons, 2+ at achilles tendons, Babinski's downgoing.  Strength at foot  Plantar-flexion: 5/5 Dorsi-flexion: 5/5 Eversion: 5/5 Inversion: 5/5  Leg strength  Quad: 5/5 Hamstring: 5/5 Hip flexor: 5/5 Hip abductors: 4/5 on the right compared to left Gait unremarkable.  Procedure: Real-time Ultrasound Guided Injection of right piriformis tendon sheath Device: GE Logiq Q7 Ultrasound guided injection is preferred based studies that show increased duration, increased effect, greater accuracy, decreased procedural pain,  increased response rate, and decreased cost with ultrasound guided versus blind injection.  Verbal informed consent obtained.  Time-out conducted.  Noted no overlying erythema, induration, or other signs of local infection.  Skin prepped in a sterile fashion.  Local anesthesia: Topical Ethyl chloride.  With sterile technique and under real time ultrasound guidance: With a 21 gauge 2 inch needle patient was injected with a total of 2 cc of 0.5% Marcaine and 1 cc of Kenalog 40 mg/mL into the right piriformis tendon sheath Completed without difficulty  Pain immediately resolved suggesting accurate placement of the medication.  Advised to call if fevers/chills, erythema, induration, drainage, or persistent bleeding.  Images permanently stored and available for review in the ultrasound unit.  Impression: Technically successful ultrasound guided injection.    Impression and Recommendations:     This case required medical decision making of moderate complexity.      Note: This dictation was prepared with Dragon dictation along with  smaller phrase technology. Any transcriptional errors that result from this process are unintentional.

## 2017-09-15 ENCOUNTER — Ambulatory Visit: Payer: Self-pay

## 2017-09-15 ENCOUNTER — Encounter: Payer: Self-pay | Admitting: Family Medicine

## 2017-09-15 ENCOUNTER — Ambulatory Visit: Payer: BLUE CROSS/BLUE SHIELD | Admitting: Family Medicine

## 2017-09-15 VITALS — BP 126/82 | HR 88 | Ht 60.0 in | Wt 189.0 lb

## 2017-09-15 DIAGNOSIS — G5701 Lesion of sciatic nerve, right lower limb: Secondary | ICD-10-CM | POA: Diagnosis not present

## 2017-09-15 NOTE — Patient Instructions (Signed)
Good to see you  Ice 20 minutes 2 times daily. Usually after activity and before bed. Piriformis injection given today  New exercises given today  Restart the gabapentin regularly.  See me again in 3-5 weeks to make sure it is all the way gone.  PRP is another idea if it comes back

## 2017-09-15 NOTE — Assessment & Plan Note (Signed)
Piriformis Syndrome  Using an anatomical model, reviewed with the patient the structures involved and how they related to diagnosis. The patient indicated understanding.   The patient was given a handout from Dr. Ailene Ardsouzier's book "The Sports Medicine Patient Advisor" describing the anatomy and rehabilitation of the following condition: Piriformis Syndrome  Also given a handout with more extensive Piriformis stretching, hip flexor and abductor strengthening, ham stretching  Rec deep massage, explained self-massage with ball Patient given injection today as well.  Hopefully will respond well.  Patient declined formal physical therapy.  Gabapentin encouraged at night in case there is some lumbar radiculopathy within the differential.  Follow-up again in 4 weeks

## 2017-10-13 ENCOUNTER — Encounter: Payer: Self-pay | Admitting: Family Medicine

## 2017-10-13 ENCOUNTER — Ambulatory Visit: Payer: BLUE CROSS/BLUE SHIELD | Admitting: Family Medicine

## 2017-10-13 DIAGNOSIS — M999 Biomechanical lesion, unspecified: Secondary | ICD-10-CM | POA: Diagnosis not present

## 2017-10-13 DIAGNOSIS — G5701 Lesion of sciatic nerve, right lower limb: Secondary | ICD-10-CM | POA: Diagnosis not present

## 2017-10-13 MED ORDER — VENLAFAXINE HCL ER 37.5 MG PO CP24
37.5000 mg | ORAL_CAPSULE | Freq: Every day | ORAL | 1 refills | Status: DC
Start: 2017-10-13 — End: 2020-12-25

## 2017-10-13 NOTE — Assessment & Plan Note (Signed)
Continues to have difficulty.  Do think there is some potential degenerative disc disease.  May need to consider repeating x-rays or advanced imaging for radicular symptoms.  Attempted osteopathic manipulation.  Discussed home exercises and core stability.  Follow-up again in 4 to 8 weeks

## 2017-10-13 NOTE — Assessment & Plan Note (Signed)
Decision today to treat with OMT was based on Physical Exam  After verbal consent patient was treated with HVLA, ME, FPR techniques in cervical, thoracic, lumbar and sacral areas  Patient tolerated the procedure well with improvement in symptoms  Patient given exercises, stretches and lifestyle modifications  See medications in patient instructions if given  Patient will follow up in 4 weeks 

## 2017-10-13 NOTE — Patient Instructions (Addendum)
Good to see you  Effexor 37.5 mg daily  1/2 the lexapro.  Exercises 3 times a week.  Stay active Started manipulation today  See me again in 3-6 weeks

## 2017-10-13 NOTE — Progress Notes (Signed)
[name] [address] [place], [place] [address] Phone: [phone] Subjective:     CC: Back pain  [id number]  [name] is a 42 y.o. female coming in with complaint of back pain. She states that she is doing better than last time. Still in some pain and stated that it hasn't completely gone away. Doesn't take the gabapetin daily because it makes her feel funny. Patient was given a piriformis injection at last exam with improvement.  Patient states that it is still about 60% better than what it was.  Still some discomfort and pain but nothing severe.     Past Medical History:  Diagnosis Date  . Adverse effect of intravenous anesthetic    Versed  . Astigmatism    mild  . Chronic back pain greater than 3 months duration   . Complication of anesthesia   . Cough 12/24/2011  . Dysrhythmia    tachycardia controlled with med  . GERD (gastroesophageal reflux disease)   . Headache(784.0)    migraines  . High cholesterol   . Hypothyroidism   . PONV (postoperative nausea and vomiting)   . Postural orthostatic tachycardia syndrome   . Snores   . 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   Past Surgical History:  Procedure Laterality Date  . BREAST BIOPSY    . CARDIAC CATHETERIZATION  2008   normal  . CESAREAN SECTION  05/03/2003; 02/06/2006  . DILATION AND CURETTAGE OF UTERUS  05/01/2009  . INTRAUTERINE DEVICE INSERTION  05/01/2009  . IUD REMOVAL  05/01/2009  . LAPAROSCOPIC LYSIS OF ADHESIONS N/A 07/08/2013   Procedure: LAPAROSCOPIC LYSIS OF ADHESIONS;  Surgeon: Jeani Hawking, MD;  Location: [facility];  Service: Gynecology;  Laterality: N/A;  . LAPAROSCOPIC TUBAL LIGATION Bilateral 07/08/2013   Procedure: LAPAROSCOPIC TUBAL LIGATION;  Surgeon: Jeani Hawking, MD;  Location: [facility];  Service: Gynecology;  Laterality: Bilateral;  . WRIST ARTHROSCOPY  12/31/2011   Procedure: ARTHROSCOPY WRIST;  Surgeon: Wyn Forster., MD;  Location: [facility];  Service: Orthopedics;  Laterality: Left;  Open triangular fibrocartilage complex repair   Social History   Socioeconomic History  . Marital status: Married    Spouse name: Not on file  . Number of children: Not on file  . Years of education: Not on file  . Highest education level: Not on file  Occupational History  . Not on file  Social Needs  . Financial resource strain: Not on file  . Food insecurity:    Worry: Not on file    Inability: Not on file  . Transportation needs:    Medical: Not on file    Non-medical: Not on file  Tobacco Use  . Smoking status: Current Every Day Smoker    Packs/day: 0.25    Years: 22.00    Pack years: 5.50    Types: E-cigarettes  . Smokeless tobacco: Never Used  . Tobacco comment: pt cutting back on her own  Substance and Sexual Activity  . Alcohol use: Yes    Comment: rarely  . Drug use: No  . Sexual activity: Yes    Birth control/protection: Surgical  Lifestyle  . Physical activity:    Days per week: Not on file    Minutes per session: Not on file  . Stress: Not on file  Relationships  . Social connections:    Talks on phone: Not on file  Gets together: Not on file    Attends religious service: Not on file    Active member of club or organization: Not on file    Attends meetings of clubs or organizations: Not on file    Relationship status: Not on file  Other Topics Concern  . Not on file  Social History Narrative  . Not on file   Allergies  Allergen Reactions  . Decongest-Aid [Pseudoephedrine] Other (See Comments)    TACHYCARDIA  . Prednisone Other (See Comments)    TACHYCARDIA  . Versed [Midazolam] Other (See Comments)    SEVERE ANGER REACTION   Family History  Problem Relation Age of Onset  . Breast cancer Mother   . Diabetes Mother   . Hypertension Mother   . Hypertension Father   . Breast cancer Maternal Aunt   . Diabetes Paternal Aunt   . Hypertension  Maternal Grandmother   . Hypertension Maternal Grandfather   . Hypertension Paternal Grandmother   . Diabetes Paternal Grandfather   . Hypertension Paternal Grandfather    Family history of fibromyalgia and mother  Past medical history, social, surgical and family history all reviewed in electronic medical record.  No pertanent information unless stated regarding to the chief complaint.   Review of Systems:Review of systems updated and as accurate as of 10/13/17  No headache, visual changes, nausea, vomiting, diarrhea, constipation, dizziness, abdominal pain, skin rash, fevers, chills, night sweats, weight loss, swollen lymph nodes,  chest pain, shortness of breath, mood changes.  Positive muscle aches, body aches  Objective  Blood pressure 118/80, pulse 82, height 5' (1.524 m), weight 186 lb (84.4 kg), SpO2 97 %. Systems examined below as of 10/13/17   General: No apparent distress alert and oriented x3 mood and affect normal, dressed appropriately.  HEENT: Pupils equal, extraocular movements intact  Respiratory: Patient's speak in full sentences and does not appear short of breath  Cardiovascular: No lower extremity edema, non tender, no erythema  Skin: Warm dry intact with no signs of infection or rash on extremities or on axial skeleton.  Abdomen: Soft nontender  Neuro: Cranial nerves II through XII are intact, neurovascularly intact in all extremities with 2+ DTRs and 2+ pulses.  Lymph: No lymphadenopathy of posterior or anterior cervical chain or axillae bilaterally.  Gait normal with good balance and coordination.  MSK:  Non tender with full range of motion and good stability and symmetric strength and tone of shoulders, elbows, wrist, hip, knee and ankles bilaterally.  Back Exam:  Inspection: Mild loss of lordosis Motion: Flexion 35 deg, Extension 25 deg, Side Bending to 45 deg bilaterally,  Rotation to 35 deg bilaterally  SLR laying: Negative  XSLR laying: Negative  Palpable  tenderness: Tender to palpation over the piriformis still noted.Marland Kitchen. FABER: Positive right. Sensory change: Gross sensation intact to all lumbar and sacral dermatomes.  Reflexes: 2+ at both patellar tendons, 2+ at achilles tendons, Babinski's downgoing.  Strength at foot  Plantar-flexion: 5/5 Dorsi-flexion: 5/5 Eversion: 5/5 Inversion: 5/5  Leg strength  Quad: 5/5 Hamstring: 5/5 Hip flexor: 5/5 Hip abductors: 5/5  Gait unremarkable.  Osteopathic findings C6 flexed rotated and side bent left T3 extended rotated and side bent right  T9 extended rotated and side bent left L2 flexed rotated and side bent right Sacrum right on right    Impression and Recommendations:     This case required medical decision making of moderate complexity.      Note: This dictation was prepared with Dragon dictation  along with smaller phrase technology. Any transcriptional errors that result from this process are unintentional.

## 2017-10-20 DIAGNOSIS — N760 Acute vaginitis: Secondary | ICD-10-CM | POA: Diagnosis not present

## 2017-10-20 DIAGNOSIS — Z1231 Encounter for screening mammogram for malignant neoplasm of breast: Secondary | ICD-10-CM | POA: Diagnosis not present

## 2017-10-20 DIAGNOSIS — Z1212 Encounter for screening for malignant neoplasm of rectum: Secondary | ICD-10-CM | POA: Diagnosis not present

## 2017-10-20 DIAGNOSIS — Z803 Family history of malignant neoplasm of breast: Secondary | ICD-10-CM | POA: Diagnosis not present

## 2017-10-20 DIAGNOSIS — Z808 Family history of malignant neoplasm of other organs or systems: Secondary | ICD-10-CM | POA: Diagnosis not present

## 2017-10-20 DIAGNOSIS — Z6832 Body mass index (BMI) 32.0-32.9, adult: Secondary | ICD-10-CM | POA: Diagnosis not present

## 2017-10-20 DIAGNOSIS — Z01419 Encounter for gynecological examination (general) (routine) without abnormal findings: Secondary | ICD-10-CM | POA: Diagnosis not present

## 2017-11-06 NOTE — Progress Notes (Deleted)
[name] [address] [place], [place] [address] Phone: [phone] Subjective:    I'm seeing this patient by the request  of:    CC:   OZH:[id number]:Subjective  [name] Carwinnsor is a 42 y.o. female coming in with complaint of ***  Onset-  Location Duration-  Character- Aggravating factors- Reliving factors-  Therapies tried-  Severity-     Past Medical History:  Diagnosis Date  . Adverse effect of intravenous anesthetic    Versed  . Astigmatism    mild  . Chronic back pain greater than 3 months duration   . Complication of anesthesia   . Cough 12/24/2011  . Dysrhythmia    tachycardia controlled with med  . GERD (gastroesophageal reflux disease)   . Headache(784.0)    migraines  . High cholesterol   . Hypothyroidism   . PONV (postoperative nausea and vomiting)   . Postural orthostatic tachycardia syndrome   . Snores   . 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   Past Surgical History:  Procedure Laterality Date  . BREAST BIOPSY    . CARDIAC CATHETERIZATION  2008   normal  . CESAREAN SECTION  05/03/2003; 02/06/2006  . DILATION AND CURETTAGE OF UTERUS  05/01/2009  . INTRAUTERINE DEVICE INSERTION  05/01/2009  . IUD REMOVAL  05/01/2009  . LAPAROSCOPIC LYSIS OF ADHESIONS N/A 07/08/2013   Procedure: LAPAROSCOPIC LYSIS OF ADHESIONS;  Surgeon: Jeani HawkingMichelle L Grewal, MD;  Location: [facility];  Service: Gynecology;  Laterality: N/A;  . LAPAROSCOPIC TUBAL LIGATION Bilateral 07/08/2013   Procedure: LAPAROSCOPIC TUBAL LIGATION;  Surgeon: Jeani HawkingMichelle L Grewal, MD;  Location: [facility];  Service: Gynecology;  Laterality: Bilateral;  . WRIST ARTHROSCOPY  12/31/2011   Procedure: ARTHROSCOPY WRIST;  Surgeon: Wyn Forsterobert V Sypher Jr., MD;  Location: [facility];  Service: Orthopedics;  Laterality: Left;  Open triangular fibrocartilage complex repair   Social History   Socioeconomic History  . Marital status: Married    Spouse  name: Not on file  . Number of children: Not on file  . Years of education: Not on file  . Highest education level: Not on file  Occupational History  . Not on file  Social Needs  . Financial resource strain: Not on file  . Food insecurity:    Worry: Not on file    Inability: Not on file  . Transportation needs:    Medical: Not on file    Non-medical: Not on file  Tobacco Use  . Smoking status: Current Every Day Smoker    Packs/day: 0.25    Years: 22.00    Pack years: 5.50    Types: E-cigarettes  . Smokeless tobacco: Never Used  . Tobacco comment: pt cutting back on her own  Substance and Sexual Activity  . Alcohol use: Yes    Comment: rarely  . Drug use: No  . Sexual activity: Yes    Birth control/protection: Surgical  Lifestyle  . Physical activity:    Days per week: Not on file    Minutes per session: Not on file  . Stress: Not on file  Relationships  . Social connections:    Talks on phone: Not on file    Gets together: Not on file    Attends religious service: Not on file    Active member of club or organization: Not on file    Attends meetings of clubs or organizations: Not on file    Relationship  status: Not on file  Other Topics Concern  . Not on file  Social History Narrative  . Not on file   Allergies  Allergen Reactions  . Decongest-Aid [Pseudoephedrine] Other (See Comments)    TACHYCARDIA  . Prednisone Other (See Comments)    TACHYCARDIA  . Versed [Midazolam] Other (See Comments)    SEVERE ANGER REACTION   Family History  Problem Relation Age of Onset  . Breast cancer Mother   . Diabetes Mother   . Hypertension Mother   . Hypertension Father   . Breast cancer Maternal Aunt   . Diabetes Paternal Aunt   . Hypertension Maternal Grandmother   . Hypertension Maternal Grandfather   . Hypertension Paternal Grandmother   . Diabetes Paternal Grandfather   . Hypertension Paternal Grandfather      Past medical history, social, surgical and  family history all reviewed in electronic medical record.  No pertanent information unless stated regarding to the chief complaint.   Review of Systems:Review of systems updated and as accurate as of 11/06/17  No headache, visual changes, nausea, vomiting, diarrhea, constipation, dizziness, abdominal pain, skin rash, fevers, chills, night sweats, weight loss, swollen lymph nodes, body aches, joint swelling, muscle aches, chest pain, shortness of breath, mood changes.   Objective  There were no vitals taken for this visit. Systems examined below as of 11/06/17   General: No apparent distress alert and oriented x3 mood and affect normal, dressed appropriately.  HEENT: Pupils equal, extraocular movements intact  Respiratory: Patient's speak in full sentences and does not appear short of breath  Cardiovascular: No lower extremity edema, non tender, no erythema  Skin: Warm dry intact with no signs of infection or rash on extremities or on axial skeleton.  Abdomen: Soft nontender  Neuro: Cranial nerves II through XII are intact, neurovascularly intact in all extremities with 2+ DTRs and 2+ pulses.  Lymph: No lymphadenopathy of posterior or anterior cervical chain or axillae bilaterally.  Gait normal with good balance and coordination.  MSK:  Non tender with full range of motion and good stability and symmetric strength and tone of shoulders, elbows, wrist, hip, knee and ankles bilaterally.     Impression and Recommendations:     This case required medical decision making of moderate complexity.      Note: This dictation was prepared with Dragon dictation along with smaller phrase technology. Any transcriptional errors that result from this process are unintentional.

## 2017-11-10 ENCOUNTER — Ambulatory Visit: Payer: BLUE CROSS/BLUE SHIELD | Admitting: Family Medicine

## 2017-11-12 DIAGNOSIS — E881 Lipodystrophy, not elsewhere classified: Secondary | ICD-10-CM | POA: Diagnosis not present

## 2017-12-01 DIAGNOSIS — Z809 Family history of malignant neoplasm, unspecified: Secondary | ICD-10-CM | POA: Diagnosis not present

## 2017-12-03 ENCOUNTER — Other Ambulatory Visit: Payer: Self-pay | Admitting: Obstetrics and Gynecology

## 2017-12-03 DIAGNOSIS — Z803 Family history of malignant neoplasm of breast: Secondary | ICD-10-CM

## 2017-12-08 ENCOUNTER — Ambulatory Visit: Payer: BLUE CROSS/BLUE SHIELD | Admitting: Family Medicine

## 2018-04-08 ENCOUNTER — Encounter

## 2018-04-08 ENCOUNTER — Ambulatory Visit: Payer: BLUE CROSS/BLUE SHIELD | Admitting: Family Medicine

## 2018-07-09 DIAGNOSIS — G43909 Migraine, unspecified, not intractable, without status migrainosus: Secondary | ICD-10-CM | POA: Diagnosis not present

## 2018-07-09 DIAGNOSIS — Z1331 Encounter for screening for depression: Secondary | ICD-10-CM | POA: Diagnosis not present

## 2018-07-09 DIAGNOSIS — E039 Hypothyroidism, unspecified: Secondary | ICD-10-CM | POA: Diagnosis not present

## 2018-07-09 DIAGNOSIS — Z0000 Encounter for general adult medical examination without abnormal findings: Secondary | ICD-10-CM | POA: Diagnosis not present

## 2018-07-09 DIAGNOSIS — E785 Hyperlipidemia, unspecified: Secondary | ICD-10-CM | POA: Diagnosis not present

## 2018-07-09 DIAGNOSIS — R7301 Impaired fasting glucose: Secondary | ICD-10-CM | POA: Diagnosis not present

## 2018-11-29 IMAGING — DX DG ANKLE COMPLETE 3+V*L*
3 series · 3 of 3 positions shown · non-contrast
Comparison: None.

CLINICAL DATA: Chronic LEFT ankle pain following injury 6 months
ago.

EXAM:
LEFT ANKLE COMPLETE - 3+ VIEW

[ankle ap]
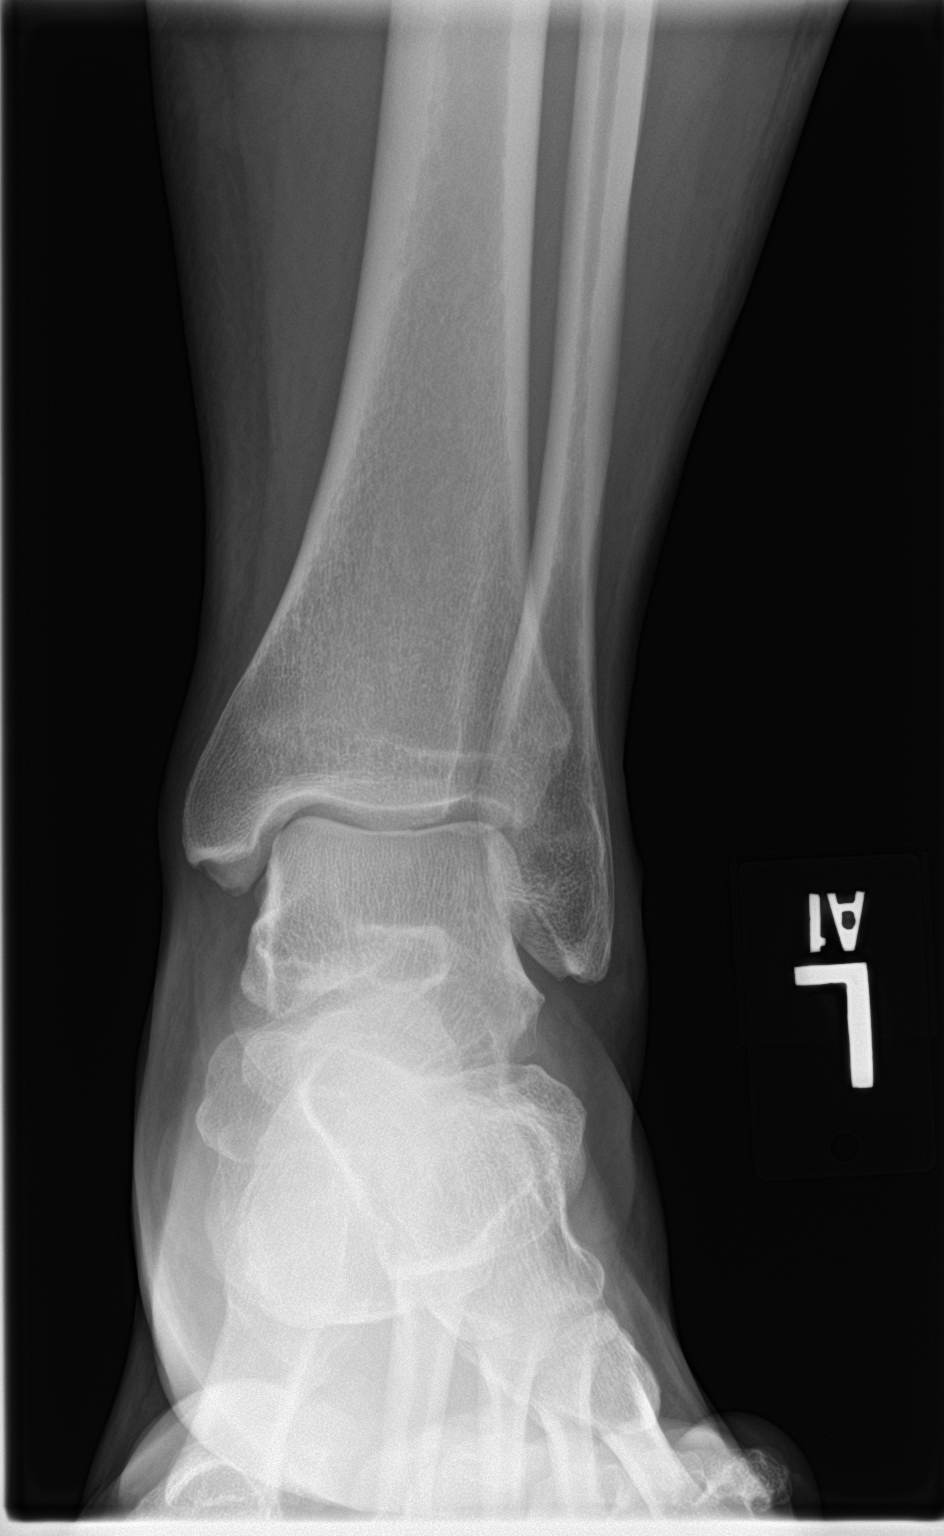

[ankle obl]
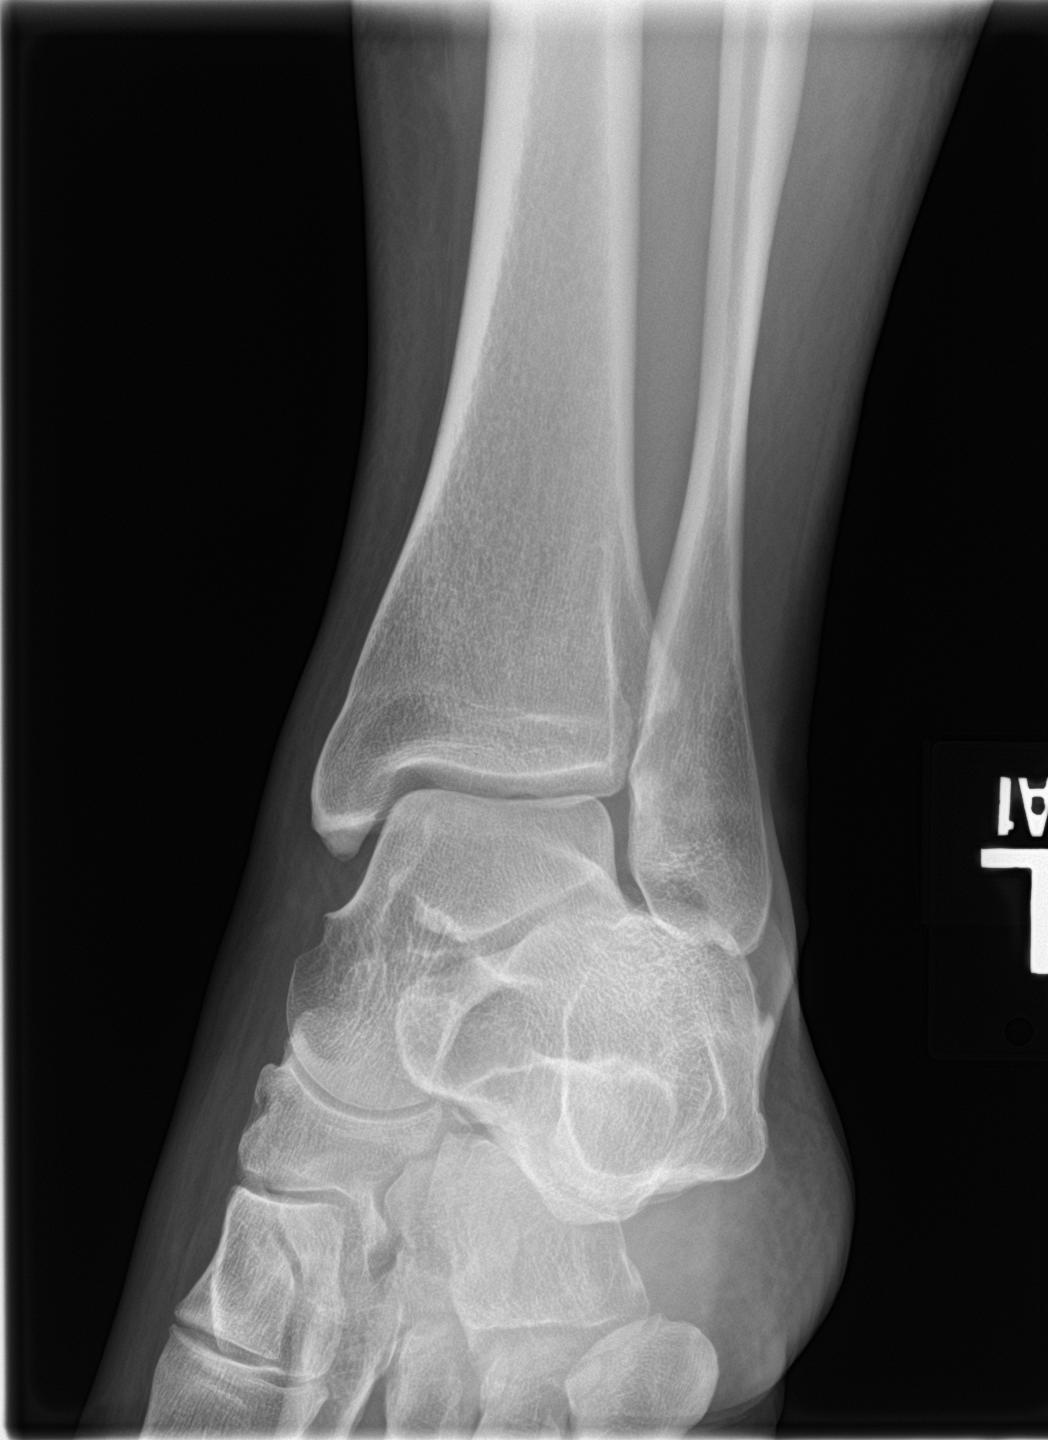

[ankle lat]
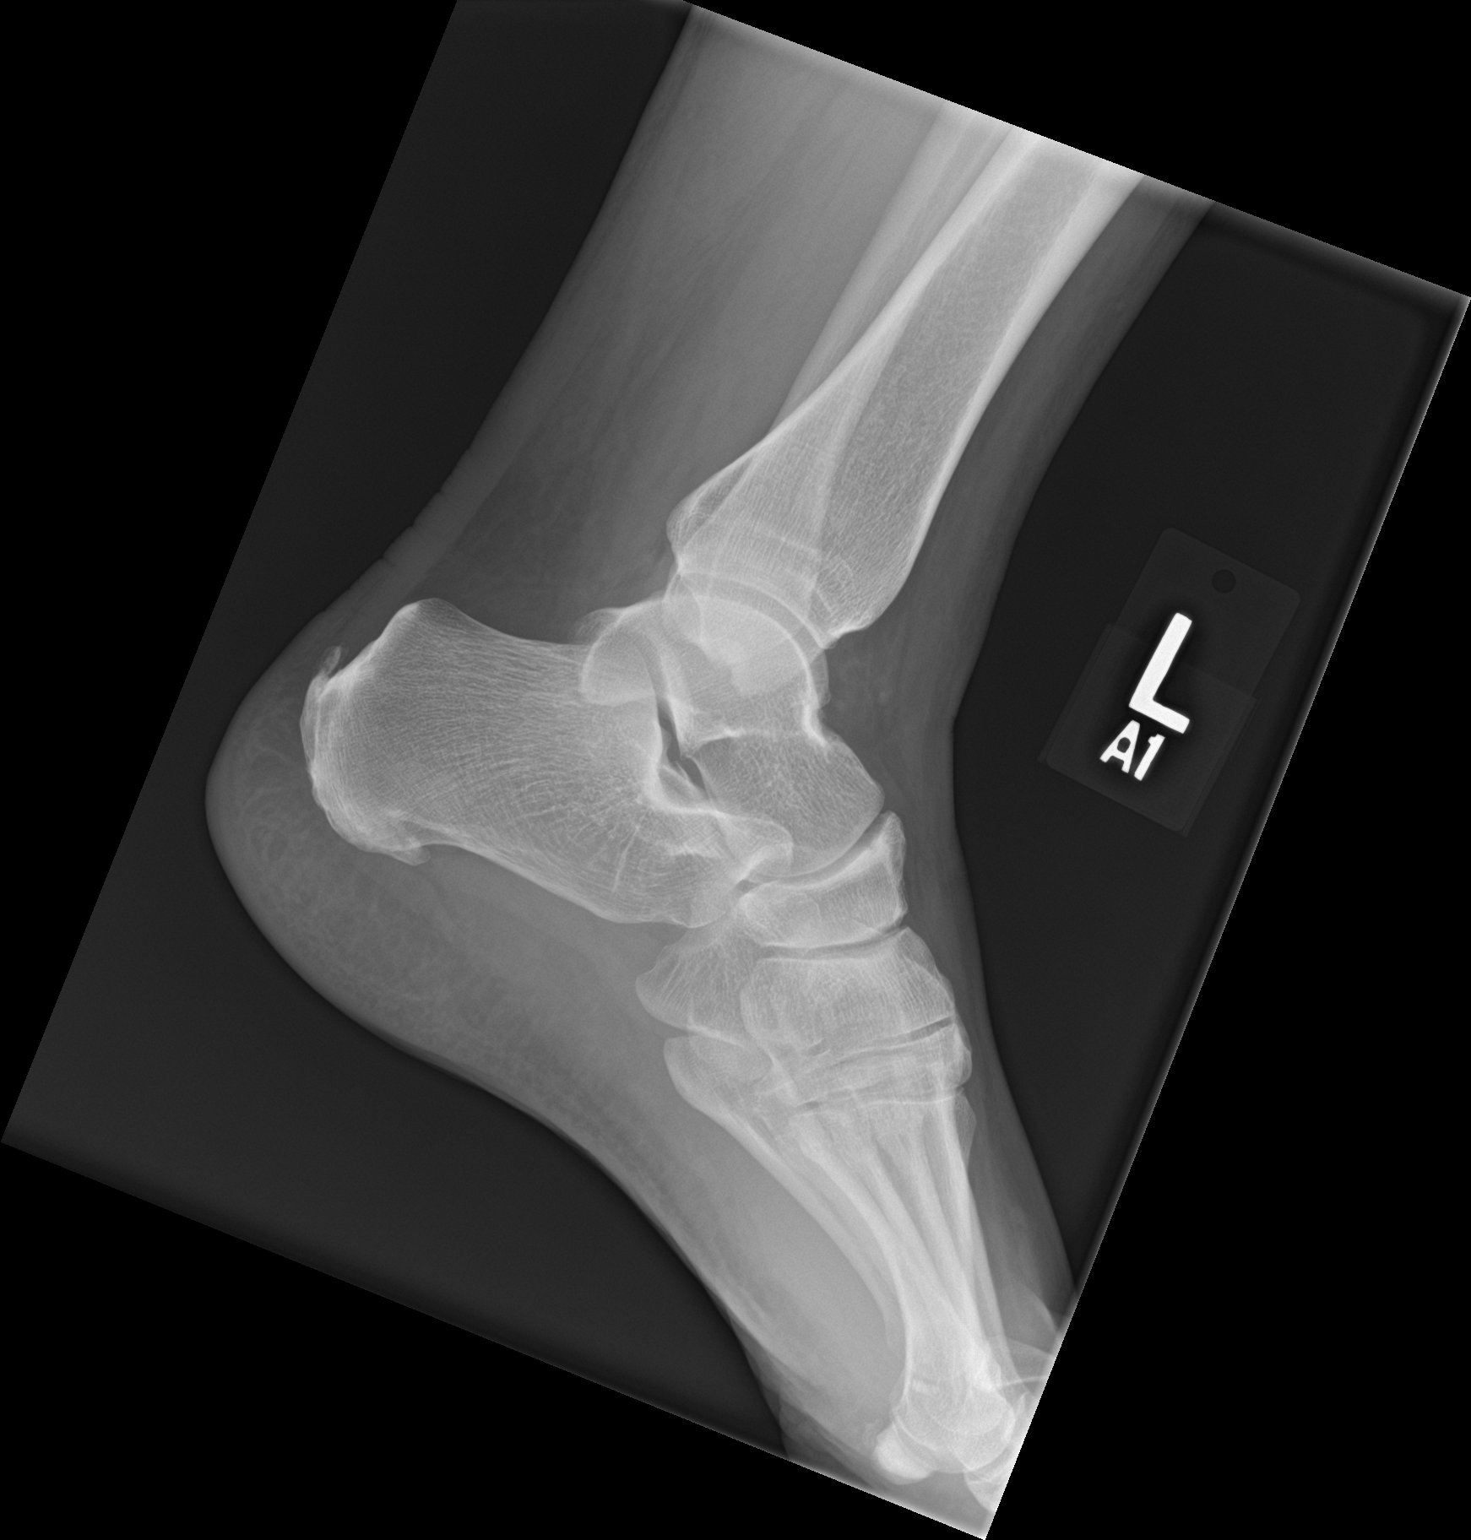

[3 of 3 positions shown; findings below may reference images not displayed]

FINDINGS: There is no evidence of fracture, dislocation, or joint effusion.
There is no evidence of arthropathy or other focal bone abnormality.
Soft tissues are unremarkable.
IMPRESSION: Negative.

## 2019-01-20 ENCOUNTER — Other Ambulatory Visit: Payer: Self-pay

## 2019-01-20 DIAGNOSIS — Z20822 Contact with and (suspected) exposure to covid-19: Secondary | ICD-10-CM

## 2019-01-21 LAB — NOVEL CORONAVIRUS, NAA: SARS-CoV-2, NAA: NOT DETECTED

## 2019-04-13 ENCOUNTER — Other Ambulatory Visit: Payer: Self-pay | Admitting: *Deleted

## 2019-04-13 DIAGNOSIS — Z20822 Contact with and (suspected) exposure to covid-19: Secondary | ICD-10-CM

## 2019-04-14 LAB — NOVEL CORONAVIRUS, NAA: SARS-CoV-2, NAA: NOT DETECTED

## 2020-04-08 ENCOUNTER — Other Ambulatory Visit: Payer: BLUE CROSS/BLUE SHIELD

## 2020-04-08 DIAGNOSIS — Z20822 Contact with and (suspected) exposure to covid-19: Secondary | ICD-10-CM

## 2020-04-11 LAB — NOVEL CORONAVIRUS, NAA: SARS-CoV-2, NAA: DETECTED — AB

## 2020-09-06 ENCOUNTER — Telehealth: Payer: Self-pay | Admitting: Neurology

## 2020-09-06 NOTE — Telephone Encounter (Signed)
Patient is requesting a provider switch from Dr. Vickey Huger to Dr. Lucia Gaskins. She is being referred for migraines. Please advise if this is acceptable, thank you!

## 2020-09-06 NOTE — Telephone Encounter (Signed)
Yes, fine with me. 

## 2020-12-25 ENCOUNTER — Other Ambulatory Visit: Payer: Self-pay

## 2020-12-25 ENCOUNTER — Encounter: Payer: Self-pay | Admitting: Neurology

## 2020-12-25 ENCOUNTER — Ambulatory Visit: Payer: 59 | Admitting: Neurology

## 2020-12-25 VITALS — BP 119/78 | HR 78 | Ht 62.5 in | Wt 175.2 lb

## 2020-12-25 DIAGNOSIS — G43709 Chronic migraine without aura, not intractable, without status migrainosus: Secondary | ICD-10-CM | POA: Diagnosis not present

## 2020-12-25 MED ORDER — ONDANSETRON 4 MG PO TBDP: 4.0000 mg | ORAL_TABLET | Freq: Three times a day (TID) | ORAL | 3 refills | Status: AC | PRN

## 2020-12-25 MED ORDER — NURTEC 75 MG PO TBDP: 75.0000 mg | ORAL_TABLET | Freq: Every day | ORAL | 11 refills | Status: DC | PRN

## 2020-12-25 NOTE — Patient Instructions (Addendum)
Start botox Discussed aimovig*, at this time will proceed to botox At onset of migraine: Nurtec or ubrelvy. Can take with ibuprofen or ondansetron(zofran) During menses you can take nurtec 4-6 days in a row; Talk to OBGyn about an estrogen patch during period if this does not work. Analgesics: Tylenol/ibuprofen: limit it to 10 days a month no more to avoid analgesic rebound [email or web address] - [place]    Analgesic Rebound Headache An analgesic rebound headache, sometimes called a medication overuse headache or a drug-induced headache, is a secondary disorder that is caused by the overuse of pain medicine (analgesic) to treat the original (primary) headache. Any type of primary headache can return as a rebound headache if a person regularly takes analgesics. The types of primary headaches that are commonly associated with rebound headaches include: Migraines. Headaches that are caused by tense muscles in the head and neck area (tension headaches). Headaches that develop and happen again on one side of the head and around the eye (cluster headaches). If rebound headaches continue, they can become long-term, daily headaches. What are the causes? This condition may be caused by frequent use of: Over-the-counter medicines such as aspirin, ibuprofen, and acetaminophen. Sinus-relief medicines and medicines that contain caffeine. Narcotic pain medicines such as codeine and oxycodone. Some prescription migraine medicines. What are the signs or symptoms? The symptoms of a rebound headache are the same as the symptoms of the original headache. Some of the symptoms of specific types of headaches include: Migraine headache Pulsing or throbbing pain on one or both sides of the head. Severe pain that interferes with daily activities. Pain that gets worse with physical activity. Nausea, vomiting, or both. Pain and sensitivity with exposure to bright light, loud noises, or strong smells. Visual  changes. Numbness of one or both arms. Tension headache Pressure around the head. Dull, aching head pain. Pain felt over the front and sides of the head. Tenderness in the muscles of the head, neck, and shoulders. Cluster headache Severe pain that begins in or around one eye or temple. Droopy or swollen eyelid, or redness and tearing in the eye on the same side as the pain. One-sided head pain. Nausea. Runny nose. Sweaty, pale facial skin. Restlessness. How is this diagnosed? This condition is diagnosed by: Reviewing your medical history. This includes the nature of your primary headaches. Reviewing the types of pain medicines that you have been using to treat your primary headaches and how often you take them. How is this treated? This condition may be treated or managed by: Discontinuing frequent use of the analgesic medicine. Doing this may worsen your headaches at first, but the pain should eventually become more manageable, less frequent, and less severe. Seeing a headache specialist. He or she may be able to help you manage your headaches and help make sure there is not another cause of the headaches. Using methods of stress relief, such as acupuncture, counseling, biofeedback, and massage. Talk with your health care provider about which methods might be good for you. Follow these instructions at home: Medicines  Take over-the-counter and prescription medicines only as told by your health care provider. Stop the repeated use of pain medicine as told by your health care provider. Stopping can be difficult. Carefully follow instructions from your health care provider. Lifestyle  Follow a regular sleep schedule. Do not vary the time that you go to bed or the amount that you sleep from day to day. It is important to stay on the same schedule to help  prevent headaches. Get 7-9 hours of sleep each night, or the amount recommended by your health care provider. Exercise regularly.  Exercise for at least 30 minutes, 5 times each week. Limit or manage stress. Consider stress-relief options such as acupuncture, counseling, biofeedback, and massage. Talk with your health care provider about which methods might be good for you. Do not drink alcohol. Do not use any products that contain nicotine or tobacco, such as cigarettes, e-cigarettes, and chewing tobacco. If you need help quitting, ask your health care provider. General instructions Avoid triggers that are known to cause your primary headaches. Keep all follow-up visits as told by your health care provider. This is important. Contact a health care provider if: You continue to experience headaches after following treatments that your health care provider recommended. Get help right away if you have: New headache pain. Headache pain that is different than what you have experienced in the past. Numbness or tingling in your arms or legs. Changes in your speech or vision. Summary An analgesic rebound headache, sometimes called a medication overuse headache or a drug-induced headache, is caused by the overuse of pain medicine (analgesic) to treat the original (primary) headache. Any type of primary headache can return as a rebound headache if a person regularly takes analgesics. The types of primary headaches that are commonly associated with rebound headaches include migraines, tension headaches, and cluster headaches. Analgesic rebound headaches can occur with frequent use of over-the-counter medicines and prescription medicines. Treatment involves stopping the medicine that is being overused. This will improve headache frequency and severity. This information is not intended to replace advice given to you by your health care provider. Make sure you discuss any questions you have with your health care provider. Document Revised: 04/08/2019 Document Reviewed: 04/08/2019 Elsevier Patient Education  2022 [employer]  Rimegepant oral dissolving tablet What is this medication? RIMEGEPANT (ri ME je pant) is used to treat migraine headaches with or without aura. An aura is a strange feeling or visual disturbance that warns you of an attack. It is also used to prevent migraine headaches. This medicine may be used for other purposes; ask your health care provider or pharmacist if you have questions. COMMON BRAND NAME(S): NURTEC ODT What should I tell my care team before I take this medication? They need to know if you have any of these conditions: kidney disease liver disease an unusual or allergic reaction to rimegepant, other medicines, foods, dyes, or preservatives pregnant or trying to get pregnant breast-feeding How should I use this medication? Take the medicine by mouth. Follow the directions on the prescription label. Leave the tablet in the sealed blister pack until you are ready to take it. With dry hands, open the blister and gently remove the tablet. If the tablet breaks or crumbles, throw it away and take a new tablet out of the blister pack. Place the tablet in the mouth and allow it to dissolve, and then swallow. Do not cut, crush, or chew this medicine. You do not need water to take this medicine. Talk to your pediatrician about the use of this medicine in children. Special care may be needed. Overdosage: If you think you have taken too much of this medicine contact a poison control center or emergency room at once. NOTE: This medicine is only for you. Do not share this medicine with others. What if I miss a dose? This does not apply. This medicine is not for regular use. What may interact with this medication?  This medicine may interact with the following medications: certain medicines for fungal infections like fluconazole, itraconazole rifampin This list may not describe all possible interactions. Give your health care provider a list of all the medicines, herbs, non-prescription  drugs, or dietary supplements you use. Also tell them if you smoke, drink alcohol, or use illegal drugs. Some items may interact with your medicine. What should I watch for while using this medication? Visit your health care professional for regular checks on your progress. Tell your health care professional if your symptoms do not start to get better or if they get worse. What side effects may I notice from receiving this medication? Side effects that you should report to your doctor or health care professional as soon as possible: allergic reactions like skin rash, itching or hives; swelling of the face, lips, or tongue Side effects that usually do not require medical attention (report these to your doctor or health care professional if they continue or are bothersome): nausea This list may not describe all possible side effects. Call your doctor for medical advice about side effects. You may report side effects to FDA at 1-800-FDA-1088. Where should I keep my medication? Keep out of the reach of children and pets. Store at room temperature between 20 and 25 degrees C (68 and 77 degrees F). Get rid of any unused medicine after the expiration date. To get rid of medicines that are no longer needed or have expired: Take the medicine to a medicine take-back program. Check with your pharmacy or law enforcement to find a location. If you cannot return the medicine, check the label or package insert to see if the medicine should be thrown out in the garbage or flushed down the toilet. If you are not sure, ask your health care provider. If it is safe to put it in the trash, take the medicine out of the container. Mix the medicine with cat litter, dirt, coffee grounds, or other unwanted substance. Seal the mixture in a bag or container. Put it in the trash. NOTE: This sheet is a summary. It may not cover all possible information. If you have questions about this medicine, talk to your doctor, pharmacist, or  health care provider.  2022 Elsevier/Gold Standard (2019-08-24 17:56:55) Erenumab injection What is this medication? ERENUMAB (e REN ue mab) is used to prevent migraine headaches. This medicine may be used for other purposes; ask your health care provider or pharmacist if you have questions. COMMON BRAND NAME(S): Aimovig What should I tell my care team before I take this medication? They need to know if you have any of these conditions: an unusual or allergic reaction to erenumab, latex, other medicines, foods, dyes, or preservatives high blood pressure pregnant or trying to get pregnant breast-feeding How should I use this medication? This medicine is for injection under the skin. You will be taught how to prepare and give this medicine. Use exactly as directed. Take your medicine at regular intervals. Do not take your medicine more often than directed. It is important that you put your used needles and syringes in a special sharps container. Do not put them in a trash can. If you do not have a sharps container, call your pharmacist or healthcare provider to get one. Talk to your pediatrician regarding the use of this medicine in children. Special care may be needed. Overdosage: If you think you have taken too much of this medicine contact a poison control center or emergency room at once. NOTE: This  medicine is only for you. Do not share this medicine with others. What if I miss a dose? If you miss a dose, take it as soon as you can. If it is almost time for your next dose, take only that dose. Do not take double or extra doses. What may interact with this medication? Interactions are not expected. This list may not describe all possible interactions. Give your health care provider a list of all the medicines, herbs, non-prescription drugs, or dietary supplements you use. Also tell them if you smoke, drink alcohol, or use illegal drugs. Some items may interact with your medicine. What  should I watch for while using this medication? Tell your doctor or healthcare professional if your symptoms do not start to get better or if they get worse. What side effects may I notice from receiving this medication? Side effects that you should report to your doctor or health care professional as soon as possible: allergic reactions like skin rash, itching or hives, swelling of the face, lips, or tongue chest pain fast, irregular heartbeat feeling faint or lightheaded palpitations Side effects that usually do not require medical attention (report these to your doctor or health care professional if they continue or are bothersome): constipation muscle cramps pain, redness, or irritation at site where injected This list may not describe all possible side effects. Call your doctor for medical advice about side effects. You may report side effects to FDA at 1-800-FDA-1088. Where should I keep my medication? Keep out of the reach of children. You will be instructed on how to store this medicine. Throw away any unused medicine after the expiration date on the label. NOTE: This sheet is a summary. It may not cover all possible information. If you have questions about this medicine, talk to your doctor, pharmacist, or health care provider.  2022 Elsevier/Gold Standard (2018-07-27 15:43:58) OnabotulinumtoxinA injection (Medical Use) What is this medication? ONABOTULINUMTOXINA (o na BOTT you lye num tox in eh) is a neuro-muscular blocker. This medicine is used to treat crossed eyes, eyelid spasms, severe neck muscle spasms, ankle and toe muscle spasms, and elbow, wrist, and finger muscle spasms. It is also used to treat excessive underarm sweating, to prevent chronic migraine headaches, and to treat loss of bladder control due to neurologic conditions such as multiple sclerosis or spinal cord injury. This medicine may be used for other purposes; ask your health care provider or pharmacist if you  have questions. COMMON BRAND NAME(S): Botox What should I tell my care team before I take this medication? They need to know if you have any of these conditions: breathing problems cerebral palsy spasms difficulty urinating heart problems history of surgery where this medicine is going to be used infection at the site where this medicine is going to be used myasthenia gravis or other neurologic disease nerve or muscle disease surgery plans take medicines that treat or prevent blood clots thyroid problems an unusual or allergic reaction to botulinum toxin, albumin, other medicines, foods, dyes, or preservatives pregnant or trying to get pregnant breast-feeding How should I use this medication? This medicine is for injection into a muscle. It is given by a health care professional in a hospital or clinic setting. Talk to your pediatrician regarding the use of this medicine in children. While this drug may be prescribed for children as young as 77 years old for selected conditions, precautions do apply. Overdosage: If you think you have taken too much of this medicine contact a poison control  center or emergency room at once. NOTE: This medicine is only for you. Do not share this medicine with others. What if I miss a dose? This does not apply. What may interact with this medication? aminoglycoside antibiotics like gentamicin, neomycin, tobramycin muscle relaxants other botulinum toxin injections This list may not describe all possible interactions. Give your health care provider a list of all the medicines, herbs, non-prescription drugs, or dietary supplements you use. Also tell them if you smoke, drink alcohol, or use illegal drugs. Some items may interact with your medicine. What should I watch for while using this medication? Visit your doctor for regular check ups. This medicine will cause weakness in the muscle where it is injected. Tell your doctor if you feel unusually weak in  other muscles. Get medical help right away if you have problems with breathing, swallowing, or talking. This medicine might make your eyelids droop or make you see blurry or double. If you have weak muscles or trouble seeing do not drive a car, use machinery, or do other dangerous activities. This medicine contains albumin from human blood. It may be possible to pass an infection in this medicine, but no cases have been reported. Talk to your doctor about the risks and benefits of this medicine. If your activities have been limited by your condition, go back to your regular routine slowly after treatment with this medicine. What side effects may I notice from receiving this medication? Side effects that you should report to your doctor or health care professional as soon as possible: allergic reactions like skin rash, itching or hives, swelling of the face, lips, or tongue breathing problems changes in vision chest pain or tightness eye irritation, pain fast, irregular heartbeat infection numbness speech problems swallowing problems unusual weakness Side effects that usually do not require medical attention (report to your doctor or health care professional if they continue or are bothersome): bruising or pain at site where injected drooping eyelid dry eyes or mouth headache muscles aches, pains sensitivity to light tearing This list may not describe all possible side effects. Call your doctor for medical advice about side effects. You may report side effects to FDA at 1-800-FDA-1088. Where should I keep my medication? This drug is given in a hospital or clinic and will not be stored at home. NOTE: This sheet is a summary. It may not cover all possible information. If you have questions about this medicine, talk to your doctor, pharmacist, or health care provider.  2022 Elsevier/Gold Standard (2017-09-15 14:21:42)

## 2020-12-25 NOTE — Progress Notes (Signed)
[facility]    Provider:  Dr Lucia Gaskins Requesting Provider: Cleatis Polka., MD Primary Care Provider:  Cleatis Polka., MD  CC:  Migraines  HPI:  [name] is a 45 y.o. female here as requested by Cleatis Polka., MD for migraines. PMHx hypoxemia, migraine, headache, hypothyroidism, snoring. Works at [employer]. She wakes up with morning headaches but this is not new, ongoing, and often headaches. Emgality helped with the everyday headaches. She started having a reaction at the site of injection, Dr. Clelia Croft switched her to [employer] and insurance gave her a problem but had reaction. She was on topamax for years. She saw Dr. Neale Burly. She has had migraines for years, her mother had migraines, her daughter has migraines. Bernita Raisin works most of the time. Worse during period. 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 She often wakes with headaches, she was diagnosed with hypoxemia overnight and cannot tolerate cpap but her migraines occur later in the day and the morning headaches are different. Migraines can last 3-4 days. Migraines are behind the right eye, pressure, moderate to severe pain, photo/phonophobia,nausea,pulsating/pounding/throbbing, movement makes it worse, xanax and bed is the only thing that gives her relief some days. She has daily headaches but > 15 migraine days a month. She has worse migraines around her cycle 3-4 days, anytime it rains she gets one, some months are good others are worse. When on Aimovig she was mostly headache free but again skin reaction, also has migraines during period and rain. Bernita Raisin didn't always work even when she was on the [employer]. Emgality was just as good as the aimovig but had th site reaction. No aura. No medication overuse. Not exertional. No vision changes. Not positional.Ongoing at this severity and frequency for > 1 year as above. No other focal neurologic deficits, associated symptoms,  inciting events or modifiable factors.  Reviewed notes, labs and imaging from outside physicians, which showed:  I reviewed sleep study data, there was evidence of hypoxemia, O2 nadir 87%, CPAP was recommended, sleep was fragmented but no other physiologic sleep disorder was identified.  She was identified with hypersomnia, to help with daytime alertness she was given Adderall because of her Epworth score of 13 out of 24.  Modafinil was also discussed.  CPAP was recommended.  That test was March 03, 2017, AHI was 6.3, REM AHI was 21.6 fundus snoring recorded, chronic insomnia.  CT head 04/15/2010: showed No acute intracranial abnormalities including mass lesion or mass effect, hydrocephalus, extra-axial fluid collection, midline shift, hemorrhage, or acute infarction, large ischemic events (personally reviewed images)  From a thorough review of records, medications tried that can be used for migraine management include carvedilol(side effects), Lexapro, Benadryl, gabapentin, Emgality(injection site reaction), ibuprofen, meloxicam, Reglan, ondansetron, propranolol(contraindication due to hypotension and had side effects), tizanidine(made tired), topiramate(significant cognitive side effects), Ubrelvy(not entirely effective), Effexor, amitriptyline(fatigue), Zonegran.Chest pain with triptans: Contraindicated,was diagnosed with coronary spasm.   Review of Systems: Patient complains of symptoms per HPI as well as the following symptoms headache. Pertinent negatives and positives per HPI. All others negative.   Social History   Socioeconomic History   Marital status: Married    Spouse name: Not on file   Number of children: Not on file   Years of education: Not on file   Highest education level: Not on file  Occupational History   Not on file  Tobacco Use   Smoking status: Every Day  Packs/day: 0.25    Years: 22.00    Pack years: 5.50    Types: E-cigarettes, Cigarettes   Smokeless  tobacco: Never   Tobacco comments:    pt cutting back on her own  Substance and Sexual Activity   Alcohol use: Yes    Comment: rarely   Drug use: No   Sexual activity: Yes    Birth control/protection: Surgical  Other Topics Concern   Not on file  Social History Narrative   Not on file   Social Determinants of Health   Financial Resource Strain: Not on file  Food Insecurity: Not on file  Transportation Needs: Not on file  Physical Activity: Not on file  Stress: Not on file  Social Connections: Not on file  Intimate Partner Violence: Not on file    Family History  Problem Relation Age of Onset   Breast cancer Mother    Diabetes Mother    Hypertension Mother    Hypertension Father    Breast cancer Maternal Aunt    Diabetes Paternal Aunt    Hypertension Maternal Grandmother    Hypertension Maternal Grandfather    Hypertension Paternal Grandmother    Diabetes Paternal Grandfather    Hypertension Paternal Grandfather     Past Medical History:  Diagnosis Date   Adverse effect of intravenous anesthetic    Versed   Astigmatism    mild   Chronic back pain greater than 3 months duration    Complication of anesthesia    Cough 12/24/2011   Dysrhythmia    tachycardia controlled with med   GERD (gastroesophageal reflux disease)    Headache(784.0)    migraines   High cholesterol    Hypothyroidism    PONV (postoperative nausea and vomiting)    Postural orthostatic tachycardia syndrome    Snores    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    Patient Active Problem List   Diagnosis Date Noted   Chronic migraine without aura without status migrainosus, not intractable 12/25/2020   Nonallopathic lesion of sacral region 10/13/2017   Nonallopathic lesion of lumbosacral region 10/13/2017   Nonallopathic lesion of thoracic region 10/13/2017   Piriformis syndrome, right 09/15/2017   Peroneal tendinitis of left lower extremity 06/12/2017    Chronic pain of left ankle 05/22/2017   Snoring 02/19/2016   Anxiety disorder 02/19/2016   Chronic insomnia 02/19/2016   Class 3 obesity due to excess calories without serious comorbidity in adult 02/19/2016   Other and unspecified hyperlipidemia 03/28/2013   Unspecified hypothyroidism 03/28/2013   Chronic headaches 03/28/2013   Insomnia 03/28/2013   Chronic pain of left wrist status post TFCC repair 03/28/2013   BMI 27.0-27.9,adult 03/28/2013   GERD (gastroesophageal reflux disease) 03/28/2013   Smoker 03/28/2013    Past Surgical History:  Procedure Laterality Date   BREAST BIOPSY     CARDIAC CATHETERIZATION  2008   normal   CESAREAN SECTION  05/03/2003; 02/06/2006   DILATION AND CURETTAGE OF UTERUS  05/01/2009   INTRAUTERINE DEVICE INSERTION  05/01/2009   IUD REMOVAL  05/01/2009   LAPAROSCOPIC LYSIS OF ADHESIONS N/A 07/08/2013   Procedure: LAPAROSCOPIC LYSIS OF ADHESIONS;  Surgeon: Jeani Hawking, MD;  Location: [facility];  Service: Gynecology;  Laterality: N/A;   LAPAROSCOPIC TUBAL LIGATION Bilateral 07/08/2013   Procedure: LAPAROSCOPIC TUBAL LIGATION;  Surgeon: Jeani Hawking, MD;  Location: [facility];  Service: Gynecology;  Laterality: Bilateral;   WRIST ARTHROSCOPY  12/31/2011   Procedure: ARTHROSCOPY WRIST;  Surgeon: Wyn Forster., MD;  Location: [facility];  Service: Orthopedics;  Laterality: Left;  Open triangular fibrocartilage complex repair   WRIST SURGERY     approx v2015    Current Outpatient Medications  Medication Sig Dispense Refill   ALPRAZolam (XANAX) 0.5 MG tablet Take 0.5 mg by mouth daily as needed.     escitalopram (LEXAPRO) 20 MG tablet Take 20 mg by mouth daily.     ondansetron (ZOFRAN-ODT) 4 MG disintegrating tablet Take 1-2 tablets (4-8 mg total) by mouth every 8 (eight) hours as needed. 30 tablet 3   OVER THE COUNTER MEDICATION Take 1 capsule by mouth daily. Essential Oil     ranitidine (ZANTAC) 150 MG tablet Take 150 mg by mouth  2 (two) times daily.     Rimegepant Sulfate (NURTEC) 75 MG TBDP Take 75 mg by mouth daily as needed. For migraines. Take as close to onset of migraine as possible. One daily maximum. 8 tablet 11   Ubrogepant (UBRELVY) 100 MG TABS Take by mouth 2 (two) times daily as needed.     UNABLE TO FIND Med Name: Trial medication for headaches. Pt is in a clinical trial.     valACYclovir (VALTREX) 1000 MG tablet Take 1,000 mg by mouth daily as needed.     zolpidem (AMBIEN) 10 MG tablet Take 10 mg by mouth at bedtime as needed.     No current facility-administered medications for this visit.    Allergies as of 12/25/2020 - Review Complete 12/25/2020  Allergen Reaction Noted   Decongest-aid [pseudoephedrine] Other (See Comments) 12/24/2011   Prednisone Other (See Comments) 12/24/2011   Versed [midazolam] Other (See Comments) 12/24/2011   Emgality [galcanezumab-gnlm]  12/25/2020    Vitals: BP 119/78   Pulse 78   Ht 5' 2.5" (1.588 m)   Wt 175 lb 3.2 oz (79.5 kg)   BMI 31.53 kg/m  Last Weight:  Wt Readings from Last 1 Encounters:  12/25/20 175 lb 3.2 oz (79.5 kg)   Last Height:   Ht Readings from Last 1 Encounters:  12/25/20 5' 2.5" (1.588 m)     Physical exam: Exam: Gen: NAD, conversant, well nourised, obese, well groomed                     CV: RRR, no MRG. No Carotid Bruits. No peripheral edema, warm, nontender Eyes: Conjunctivae clear without exudates or hemorrhage  Neuro: Detailed Neurologic Exam  Speech:    Speech is normal; fluent and spontaneous with normal comprehension.  Cognition:    The patient is oriented to person, place, and time;     recent and remote memory intact;     language fluent;     normal attention, concentration,     fund of knowledge Cranial Nerves:    The pupils are equal, round, and reactive to light. The fundi are flat. Visual fields are full to finger confrontation. Extraocular movements are intact. Trigeminal sensation is intact and the muscles of  mastication are normal. The face is symmetric. The palate elevates in the midline. Hearing intact. Voice is normal. Shoulder shrug is normal. The tongue has normal motion without fasciculations.   Coordination:    Normal   Gait:     normal.   Motor Observation:    No asymmetry, no atrophy, and no involuntary movements noted. Tone:    Normal muscle tone.    Posture:    Posture is normal. normal erect    Strength:  Strength is V/V in the upper and lower limbs.      Sensation: intact to LT     Reflex Exam:  DTR's:    Deep tendon reflexes in the upper and lower extremities are normal bilaterally.   Toes:    The toes are downgoing bilaterally.   Clonus:    Clonus is absent.    Assessment/Plan:  Patient with chronic migraines.Tried and failed multiple medications. Had a discussion of options.   Start botox for migraine At onset of migraine: Nurtec or ubrelvy. Can take with ibuprofen or ondansetron(zofran) During menses you can take nurtec 4-6 days in a row; Talk to OBGyn about an estrogen patch during period if this does not work. Analgesics: Tylenol/ibuprofen: limit it to 10 days a month no more to avoid analgesic rebound [email or web address] [facility] [place].  Discussed: To prevent or relieve headaches, try the following: Cool Compress. Lie down and place a cool compress on your head.  Avoid headache triggers. If certain foods or odors seem to have triggered your migraines in the past, avoid them. A headache diary might help you identify triggers.  Include physical activity in your daily routine. Try a daily walk or other moderate aerobic exercise.  Manage stress. Find healthy ways to cope with the stressors, such as delegating tasks on your to-do list.  Practice relaxation techniques. Try deep breathing, yoga, massage and visualization.  Eat regularly. Eating regularly scheduled meals and maintaining a healthy diet might help prevent headaches. Also, drink plenty of fluids.  Follow a  regular sleep schedule. Sleep deprivation might contribute to headaches Consider biofeedback. With this mind-body technique, you learn to control certain bodily functions -- such as muscle tension, heart rate and blood pressure -- to prevent headaches or reduce headache pain.    Proceed to emergency room if you experience new or worsening symptoms or symptoms do not resolve, if you have new neurologic symptoms or if headache is severe, or for any concerning symptom.   Provided education and documentation from American headache Society toolbox including articles on: chronic migraine medication overuse headache, chronic migraines, prevention of migraines, behavioral and other nonpharmacologic treatments for headache.   Orders Placed This Encounter  Procedures   Comprehensive metabolic panel   CBC with Differential/Platelets   TSH   Meds ordered this encounter  Medications   ondansetron (ZOFRAN-ODT) 4 MG disintegrating tablet    Sig: Take 1-2 tablets (4-8 mg total) by mouth every 8 (eight) hours as needed.    Dispense:  30 tablet    Refill:  3   Rimegepant Sulfate (NURTEC) 75 MG TBDP    Sig: Take 75 mg by mouth daily as needed. For migraines. Take as close to onset of migraine as possible. One daily maximum.    Dispense:  8 tablet    Refill:  11    Cc: Cleatis Polka., MD,  Cleatis Polka., MD  Naomie Dean, MD  [facility] [address] [place], [place] [address]  Phone [phone] Fax [phone]

## 2020-12-26 ENCOUNTER — Telehealth: Payer: Self-pay | Admitting: *Deleted

## 2020-12-26 NOTE — Telephone Encounter (Signed)
Botox charge sheet completed Dx: P03.403. pending MD signature. Pt will need to sign consent at first injection appointment.

## 2020-12-26 NOTE — Telephone Encounter (Signed)
-----   Message from Anson Fret, MD sent at 12/25/2020 10:07 PM EDT ----- Regarding: Botox Please start botox for migraine approval for patient. She can stay with me thanks

## 2020-12-27 NOTE — Telephone Encounter (Signed)
Received charge sheet for Botox. Patient currently has [employer]. I will work on obtaining PA for Botox.

## 2021-01-01 ENCOUNTER — Ambulatory Visit: Payer: 59 | Admitting: Neurology

## 2021-01-02 ENCOUNTER — Telehealth: Payer: Self-pay

## 2021-01-02 NOTE — Telephone Encounter (Signed)
I submitted a PA request on CMM, Key: [id number]. Awaiting determination from [employer].

## 2021-01-02 NOTE — Telephone Encounter (Signed)
Approval notice received from [employer] then faxed to pharmacy. Received a receipt of confirmation.

## 2021-01-02 NOTE — Telephone Encounter (Signed)
The request has been approved. The authorization is effective for a maximum of 6 fills from 01/02/2021 to 07/02/2021, as long as the member is enrolled in their current health plan. The request was approved with a quantity restriction. This has been approved for a quantity limit of 18 with a day supply limit of 30.

## 2021-01-03 NOTE — Telephone Encounter (Signed)
Received approval from [employer]. PA #[id number] (12/28/20- 03/30/21). Filled out prescription enrollment form and faxed to [facility].

## 2021-01-08 NOTE — Telephone Encounter (Signed)
I called patient and LVM requesting CB to discuss Botox. Advised patient I scheduled her for 1st available of 11/29 at 3:30 with Dr. Lucia Gaskins to hold a spot on the schedule for her. Advised her to call back and confirm this appt works for her. I also left a detailed message regarding Botox Savings Program and use of specialty pharmacy.

## 2021-01-15 NOTE — Telephone Encounter (Addendum)
Patient returned my call and confirmed Nov. 29th appointment. I called [facility] @ [phone] and spoke with McKenzie to check prescription status. They are reviewing insurance.

## 2021-01-22 NOTE — Telephone Encounter (Signed)
Completed pharmacy PA via CMM. PA was approved. PA #[id number] (01/20/21- 01/19/22).

## 2021-02-13 NOTE — Telephone Encounter (Signed)
Patient's appointment is 11/29 and we still have not received her Botox from the specialty pharmacy. The pharmacy and myself have made several attempts to get in touch with patient. Her voicemail is currently full.

## 2021-02-19 NOTE — Telephone Encounter (Signed)
Received call from [facility] regarding patient's Botox. Botox will arrive 11/30. Patient's appointment is tomorrow, we will use office stock & then replace when shipment arrives.

## 2021-02-20 ENCOUNTER — Ambulatory Visit: Payer: 59 | Admitting: Neurology

## 2021-02-20 ENCOUNTER — Encounter: Payer: Self-pay | Admitting: Neurology

## 2021-02-20 DIAGNOSIS — G43709 Chronic migraine without aura, not intractable, without status migrainosus: Secondary | ICD-10-CM

## 2021-02-20 NOTE — Progress Notes (Signed)
Botox- 200 units x 1 vial Lot: [id number] Expiration: 07/2023 NDC: 2263-3354-56  Bacteriostatic 0.9% Sodium Chloride- 20mL total Lot: [id number] Expiration: 03/25/2022 NDC: 3734-2876-81  Dx: L57.262  B/B

## 2021-02-20 NOTE — Progress Notes (Signed)
Consent Form Botulism Toxin Injection For Chronic Migraine  First Botox. She works at [employer].   Reviewed orally with patient, additionally signature is on file:  Botulism toxin has been approved by the Federal drug administration for treatment of chronic migraine. Botulism toxin does not cure chronic migraine and it may not be effective in some patients.  The administration of botulism toxin is accomplished by injecting a small amount of toxin into the muscles of the neck and head. Dosage must be titrated for each individual. Any benefits resulting from botulism toxin tend to wear off after 3 months with a repeat injection required if benefit is to be maintained. Injections are usually done every 3-4 months with maximum effect peak achieved by about 2 or 3 weeks. Botulism toxin is expensive and you should be sure of what costs you will incur resulting from the injection.  The side effects of botulism toxin use for chronic migraine may include:   -Transient, and usually mild, facial weakness with facial injections  -Transient, and usually mild, head or neck weakness with head/neck injections  -Reduction or loss of forehead facial animation due to forehead muscle weakness  -Eyelid drooping  -Dry eye  -Pain at the site of injection or bruising at the site of injection  -Double vision  -Potential unknown long term risks  Contraindications: You should not have Botox if you are pregnant, nursing, allergic to albumin, have an infection, skin condition, or muscle weakness at the site of the injection, or have myasthenia gravis, Lambert-Eaton syndrome, or ALS.  It is also possible that as with any injection, there may be an allergic reaction or no effect from the medication. Reduced effectiveness after repeated injections is sometimes seen and rarely infection at the injection site may occur. All care will be taken to prevent these side effects. If therapy is given over a long time, atrophy and  wasting in the muscle injected may occur. Occasionally the patient's become refractory to treatment because they develop antibodies to the toxin. In this event, therapy needs to be modified.  I have read the above information and consent to the administration of botulism toxin.    BOTOX PROCEDURE NOTE FOR MIGRAINE HEADACHE    Contraindications and precautions discussed with patient(above). Aseptic procedure was observed and patient tolerated procedure. Procedure performed by Dr. Artemio Aly  The condition has existed for more than 6 months, and pt does not have a diagnosis of ALS, Myasthenia Gravis or Lambert-Eaton Syndrome.  Risks and benefits of injections discussed and pt agrees to proceed with the procedure.  Written consent obtained  These injections are medically necessary. Pt  receives good benefits from these injections. These injections do not cause sedations or hallucinations which the oral therapies may cause.  Description of procedure:  The patient was placed in a sitting position. The standard protocol was used for Botox as follows, with 5 units of Botox injected at each site:   -Procerus muscle, midline injection  -Corrugator muscle, bilateral injection  -Frontalis muscle, bilateral injection, with 2 sites each side, medial injection was performed in the upper one third of the frontalis muscle, in the region vertical from the medial inferior edge of the superior orbital rim. The lateral injection was again in the upper one third of the forehead vertically above the lateral limbus of the cornea, 1.5 cm lateral to the medial injection site.  -Temporalis muscle injection, 4 sites, bilaterally. The first injection was 3 cm above the tragus of the ear, second  injection site was 1.5 cm to 3 cm up from the first injection site in line with the tragus of the ear. The third injection site was 1.5-3 cm forward between the first 2 injection sites. The fourth injection site was 1.5 cm  posterior to the second injection site.   -Occipitalis muscle injection, 3 sites, bilaterally. The first injection was done one half way between the occipital protuberance and the tip of the mastoid process behind the ear. The second injection site was done lateral and superior to the first, 1 fingerbreadth from the first injection. The third injection site was 1 fingerbreadth superiorly and medially from the first injection site.  -Cervical paraspinal muscle injection, 2 sites, bilateral knee first injection site was 1 cm from the midline of the cervical spine, 3 cm inferior to the lower border of the occipital protuberance. The second injection site was 1.5 cm superiorly and laterally to the first injection site.  -Trapezius muscle injection was performed at 3 sites, bilaterally. The first injection site was in the upper trapezius muscle halfway between the inflection point of the neck, and the acromion. The second injection site was one half way between the acromion and the first injection site. The third injection was done between the first injection site and the inflection point of the neck.   Will return for repeat injection in 3 months.   155 units of Botox was used, 45u Botox not injected was wasted. The patient tolerated the procedure well, there were no complications of the above procedure.

## 2021-02-21 NOTE — Telephone Encounter (Signed)
Received (1) 200 unit vial of Botox today from [facility]. Vial was placed in office stock to replace Botox used for appointment yesterday.

## 2021-04-05 ENCOUNTER — Telehealth: Payer: Self-pay | Admitting: Neurology

## 2021-04-05 DIAGNOSIS — G43709 Chronic migraine without aura, not intractable, without status migrainosus: Secondary | ICD-10-CM

## 2021-04-05 NOTE — Telephone Encounter (Signed)
LVM & sent mychart message asking pt for new 2023 insurance info.

## 2021-04-17 NOTE — Telephone Encounter (Signed)
Patient called in to provide new insurance information. She now has [employer]. I have filled out [employer]'s PA form and faxed to plan with notes. Botox prescription has been sent to [place]. Phone: [phone]. Dx: [id number].

## 2021-04-30 MED ORDER — BOTOX 200 UNITS IJ SOLR: INTRAMUSCULAR | 3 refills | Status: DC

## 2021-04-30 NOTE — Telephone Encounter (Signed)
Received fax from [facility] stating RX must be sent to [facility] P: [phone] F: [phone].

## 2021-04-30 NOTE — Addendum Note (Signed)
Addended by: Bertram Savin on: 04/30/2021 03:41 PM   Modules accepted: Orders

## 2021-05-02 MED ORDER — BOTOX 200 UNITS IJ SOLR: INTRAMUSCULAR | 3 refills | Status: DC

## 2021-05-02 NOTE — Telephone Encounter (Signed)
Okay I have clarity the RX needs to go to [facility]. Can be faxed to [phone] or e-prescribe to [facility], [place] ([place] [phone])

## 2021-05-02 NOTE — Addendum Note (Signed)
Addended by: Bertram Savin on: 05/02/2021 05:20 PM   Modules accepted: Orders

## 2021-05-14 NOTE — Telephone Encounter (Signed)
Faxed signed PA form with OV notes to [employer].  

## 2021-05-14 NOTE — Telephone Encounter (Signed)
Completed [employer] Botox PA form. Placed in Nurse Pod for MD signature.

## 2021-05-15 NOTE — Telephone Encounter (Signed)
Received approval from [employer]. Reference # [id number] (05/22/2021-11/18/2021).

## 2021-05-22 ENCOUNTER — Ambulatory Visit (INDEPENDENT_AMBULATORY_CARE_PROVIDER_SITE_OTHER): Payer: 59 | Admitting: Neurology

## 2021-05-22 DIAGNOSIS — G43709 Chronic migraine without aura, not intractable, without status migrainosus: Secondary | ICD-10-CM

## 2021-05-22 NOTE — Progress Notes (Addendum)
Consent Form Botulism Toxin Injection For Chronic Migraine  Second  Botox. She works at [employer]. Did well > 50% improvement in migraine frequency. Emgality gave her a reaction. Aimovig with constipation. In the future can try ajovy if needed.  Reviewed orally with patient, additionally signature is on file:  Botulism toxin has been approved by the Federal drug administration for treatment of chronic migraine. Botulism toxin does not cure chronic migraine and it may not be effective in some patients.  The administration of botulism toxin is accomplished by injecting a small amount of toxin into the muscles of the neck and head. Dosage must be titrated for each individual. Any benefits resulting from botulism toxin tend to wear off after 3 months with a repeat injection required if benefit is to be maintained. Injections are usually done every 3-4 months with maximum effect peak achieved by about 2 or 3 weeks. Botulism toxin is expensive and you should be sure of what costs you will incur resulting from the injection.  The side effects of botulism toxin use for chronic migraine may include:   -Transient, and usually mild, facial weakness with facial injections  -Transient, and usually mild, head or neck weakness with head/neck injections  -Reduction or loss of forehead facial animation due to forehead muscle weakness  -Eyelid drooping  -Dry eye  -Pain at the site of injection or bruising at the site of injection  -Double vision  -Potential unknown long term risks  Contraindications: You should not have Botox if you are pregnant, nursing, allergic to albumin, have an infection, skin condition, or muscle weakness at the site of the injection, or have myasthenia gravis, Lambert-Eaton syndrome, or ALS.  It is also possible that as with any injection, there may be an allergic reaction or no effect from the medication. Reduced effectiveness after repeated injections is sometimes seen and rarely  infection at the injection site may occur. All care will be taken to prevent these side effects. If therapy is given over a long time, atrophy and wasting in the muscle injected may occur. Occasionally the patient's become refractory to treatment because they develop antibodies to the toxin. In this event, therapy needs to be modified.  I have read the above information and consent to the administration of botulism toxin.    BOTOX PROCEDURE NOTE FOR MIGRAINE HEADACHE    Contraindications and precautions discussed with patient(above). Aseptic procedure was observed and patient tolerated procedure. Procedure performed by Dr. Artemio Aly  The condition has existed for more than 6 months, and pt does not have a diagnosis of ALS, Myasthenia Gravis or Lambert-Eaton Syndrome.  Risks and benefits of injections discussed and pt agrees to proceed with the procedure.  Written consent obtained  These injections are medically necessary. Pt  receives good benefits from these injections. These injections do not cause sedations or hallucinations which the oral therapies may cause.  Description of procedure:  The patient was placed in a sitting position. The standard protocol was used for Botox as follows, with 5 units of Botox injected at each site:   -Procerus muscle, midline injection  -Corrugator muscle, bilateral injection  -Frontalis muscle, bilateral injection, with 2 sites each side, medial injection was performed in the upper one third of the frontalis muscle, in the region vertical from the medial inferior edge of the superior orbital rim. The lateral injection was again in the upper one third of the forehead vertically above the lateral limbus of the cornea, 1.5 cm lateral to  the medial injection site.  -Temporalis muscle injection, 4 sites, bilaterally. The first injection was 3 cm above the tragus of the ear, second injection site was 1.5 cm to 3 cm up from the first injection site in line with  the tragus of the ear. The third injection site was 1.5-3 cm forward between the first 2 injection sites. The fourth injection site was 1.5 cm posterior to the second injection site.   -Occipitalis muscle injection, 3 sites, bilaterally. The first injection was done one half way between the occipital protuberance and the tip of the mastoid process behind the ear. The second injection site was done lateral and superior to the first, 1 fingerbreadth from the first injection. The third injection site was 1 fingerbreadth superiorly and medially from the first injection site.  -Cervical paraspinal muscle injection, 2 sites, bilateral knee first injection site was 1 cm from the midline of the cervical spine, 3 cm inferior to the lower border of the occipital protuberance. The second injection site was 1.5 cm superiorly and laterally to the first injection site.  -Trapezius muscle injection was performed at 3 sites, bilaterally. The first injection site was in the upper trapezius muscle halfway between the inflection point of the neck, and the acromion. The second injection site was one half way between the acromion and the first injection site. The third injection was done between the first injection site and the inflection point of the neck.   Will return for repeat injection in 3 months.   155 units of Botox was used, 45u Botox not injected was wasted. The patient tolerated the procedure well, there were no complications of the above procedure.

## 2021-05-22 NOTE — Progress Notes (Signed)
Botox- 200 units x 1 vial ?Lot: [id number] ?Expiration: 06/2023 ?NDC: 0023-3921-02 ? ?Bacteriostatic 0.9% Sodium Chloride- 4mL total ?Lot: [id number]  ?Expiration: 10/24/2022 ?NDC: 0409-1966-02 ? ?Dx: G43.709 ?B/B ? ?

## 2021-08-08 DIAGNOSIS — R7301 Impaired fasting glucose: Secondary | ICD-10-CM | POA: Diagnosis not present

## 2021-08-08 DIAGNOSIS — E785 Hyperlipidemia, unspecified: Secondary | ICD-10-CM | POA: Diagnosis not present

## 2021-08-08 DIAGNOSIS — E039 Hypothyroidism, unspecified: Secondary | ICD-10-CM | POA: Diagnosis not present

## 2021-08-14 ENCOUNTER — Other Ambulatory Visit: Payer: Self-pay | Admitting: *Deleted

## 2021-08-14 ENCOUNTER — Ambulatory Visit: Payer: 59 | Admitting: Neurology

## 2021-08-14 DIAGNOSIS — G43709 Chronic migraine without aura, not intractable, without status migrainosus: Secondary | ICD-10-CM | POA: Diagnosis not present

## 2021-08-14 MED ORDER — NURTEC 75 MG PO TBDP: 75.0000 mg | ORAL_TABLET | Freq: Every day | ORAL | 11 refills | Status: DC | PRN

## 2021-08-14 MED ORDER — UBRELVY 100 MG PO TABS: 100.0000 mg | ORAL_TABLET | Freq: Two times a day (BID) | ORAL | 11 refills | Status: AC | PRN

## 2021-08-14 NOTE — Progress Notes (Unsigned)
Botox- 200 units x 1 vial Lot: [id number] Expiration: 02/2024 NDC: 3474-2595-63  Bacteriostatic 0.9% Sodium Chloride- 20mL total Lot: [id number] Expiration: 10/23/2021 NDC: 8756-4332-95  Dx: J88.416 B/B

## 2021-08-14 NOTE — Progress Notes (Unsigned)
Consent Form Botulism Toxin Injection For Chronic Migraine  Second  Botox. She works at [employer]. Did well > 50% improvement in migraine frequency. Emgality gave her a reaction. Aimovig with constipation. In the future can try ajovy if needed. Doing well, > 50% improvement botox, 6 migraine days a month, < 14 total headache days a month, already tried imitrex, maxalt, triptans are contraindicated because of cardiovascular history, will try to get her Roselyn Meier approved.  Meds ordered this encounter  Medications   Ubrogepant (UBRELVY) 100 MG TABS    Sig: Take 100 mg by mouth 2 (two) times daily as needed.    Dispense:  16 tablet    Refill:  11    Episodic migraines, 6 migraine days a month, < 14 total headache days a month, already tried imitrex, maxalt, triptans are contraindicated because of cardiovascular history,    Reviewed orally with patient, additionally signature is on file:  Botulism toxin has been approved by the Federal drug administration for treatment of chronic migraine. Botulism toxin does not cure chronic migraine and it may not be effective in some patients.  The administration of botulism toxin is accomplished by injecting a small amount of toxin into the muscles of the neck and head. Dosage must be titrated for each individual. Any benefits resulting from botulism toxin tend to wear off after 3 months with a repeat injection required if benefit is to be maintained. Injections are usually done every 3-4 months with maximum effect peak achieved by about 2 or 3 weeks. Botulism toxin is expensive and you should be sure of what costs you will incur resulting from the injection.  The side effects of botulism toxin use for chronic migraine may include:   -Transient, and usually mild, facial weakness with facial injections  -Transient, and usually mild, head or neck weakness with head/neck injections  -Reduction or loss of forehead facial animation due to forehead muscle  weakness  -Eyelid drooping  -Dry eye  -Pain at the site of injection or bruising at the site of injection  -Double vision  -Potential unknown long term risks  Contraindications: You should not have Botox if you are pregnant, nursing, allergic to albumin, have an infection, skin condition, or muscle weakness at the site of the injection, or have myasthenia gravis, Lambert-Eaton syndrome, or ALS.  It is also possible that as with any injection, there may be an allergic reaction or no effect from the medication. Reduced effectiveness after repeated injections is sometimes seen and rarely infection at the injection site may occur. All care will be taken to prevent these side effects. If therapy is given over a long time, atrophy and wasting in the muscle injected may occur. Occasionally the patient's become refractory to treatment because they develop antibodies to the toxin. In this event, therapy needs to be modified.  I have read the above information and consent to the administration of botulism toxin.    BOTOX PROCEDURE NOTE FOR MIGRAINE HEADACHE    Contraindications and precautions discussed with patient(above). Aseptic procedure was observed and patient tolerated procedure. Procedure performed by Dr. [place] Dom  The condition has existed for more than 6 months, and pt does not have a diagnosis of ALS, Myasthenia Gravis or Lambert-Eaton Syndrome.  Risks and benefits of injections discussed and pt agrees to proceed with the procedure.  Written consent obtained  These injections are medically necessary. Pt  receives good benefits from these injections. These injections do not cause sedations or hallucinations which the  oral therapies may cause.  Description of procedure:  The patient was placed in a sitting position. The standard protocol was used for Botox as follows, with 5 units of Botox injected at each site:   -Procerus muscle, midline injection  -Corrugator muscle, bilateral  injection  -Frontalis muscle, bilateral injection, with 2 sites each side, medial injection was performed in the upper one third of the frontalis muscle, in the region vertical from the medial inferior edge of the superior orbital rim. The lateral injection was again in the upper one third of the forehead vertically above the lateral limbus of the cornea, 1.5 cm lateral to the medial injection site.  -Temporalis muscle injection, 4 sites, bilaterally. The first injection was 3 cm above the tragus of the ear, second injection site was 1.5 cm to 3 cm up from the first injection site in line with the tragus of the ear. The third injection site was 1.5-3 cm forward between the first 2 injection sites. The fourth injection site was 1.5 cm posterior to the second injection site.   -Occipitalis muscle injection, 3 sites, bilaterally. The first injection was done one half way between the occipital protuberance and the tip of the mastoid process behind the ear. The second injection site was done lateral and superior to the first, 1 fingerbreadth from the first injection. The third injection site was 1 fingerbreadth superiorly and medially from the first injection site.  -Cervical paraspinal muscle injection, 2 sites, bilateral knee first injection site was 1 cm from the midline of the cervical spine, 3 cm inferior to the lower border of the occipital protuberance. The second injection site was 1.5 cm superiorly and laterally to the first injection site.  -Trapezius muscle injection was performed at 3 sites, bilaterally. The first injection site was in the upper trapezius muscle halfway between the inflection point of the neck, and the acromion. The second injection site was one half way between the acromion and the first injection site. The third injection was done between the first injection site and the inflection point of the neck.   Will return for repeat injection in 3 months.   155 units of Botox was  used, 45u Botox not injected was wasted. The patient tolerated the procedure well, there were no complications of the above procedure.

## 2021-08-15 DIAGNOSIS — Z1331 Encounter for screening for depression: Secondary | ICD-10-CM | POA: Diagnosis not present

## 2021-08-15 DIAGNOSIS — B001 Herpesviral vesicular dermatitis: Secondary | ICD-10-CM | POA: Diagnosis not present

## 2021-08-15 DIAGNOSIS — K219 Gastro-esophageal reflux disease without esophagitis: Secondary | ICD-10-CM | POA: Diagnosis not present

## 2021-08-15 DIAGNOSIS — Z0000 Encounter for general adult medical examination without abnormal findings: Secondary | ICD-10-CM | POA: Diagnosis not present

## 2021-08-15 DIAGNOSIS — E785 Hyperlipidemia, unspecified: Secondary | ICD-10-CM | POA: Diagnosis not present

## 2021-08-15 DIAGNOSIS — E039 Hypothyroidism, unspecified: Secondary | ICD-10-CM | POA: Diagnosis not present

## 2021-08-15 DIAGNOSIS — M546 Pain in thoracic spine: Secondary | ICD-10-CM | POA: Diagnosis not present

## 2021-08-15 DIAGNOSIS — R69 Illness, unspecified: Secondary | ICD-10-CM | POA: Diagnosis not present

## 2021-08-15 DIAGNOSIS — R7301 Impaired fasting glucose: Secondary | ICD-10-CM | POA: Diagnosis not present

## 2021-08-15 DIAGNOSIS — N62 Hypertrophy of breast: Secondary | ICD-10-CM | POA: Diagnosis not present

## 2021-08-15 DIAGNOSIS — R82998 Other abnormal findings in urine: Secondary | ICD-10-CM | POA: Diagnosis not present

## 2021-08-15 DIAGNOSIS — F17211 Nicotine dependence, cigarettes, in remission: Secondary | ICD-10-CM | POA: Diagnosis not present

## 2021-08-15 DIAGNOSIS — G4700 Insomnia, unspecified: Secondary | ICD-10-CM | POA: Diagnosis not present

## 2021-08-17 ENCOUNTER — Other Ambulatory Visit: Payer: Self-pay | Admitting: Internal Medicine

## 2021-08-17 DIAGNOSIS — E785 Hyperlipidemia, unspecified: Secondary | ICD-10-CM

## 2021-08-22 ENCOUNTER — Telehealth: Payer: Self-pay | Admitting: *Deleted

## 2021-08-22 NOTE — Telephone Encounter (Signed)
Submitted PA Ubrel[employer] on [facility]. Key: [id number]. Waiting on determination from [employer].

## 2021-08-23 NOTE — Telephone Encounter (Signed)
Approval letter received from [place]. Letter has been faxed to [employer] [place]. Received a receipt of confirmation.

## 2021-08-23 NOTE — Telephone Encounter (Signed)
PA approved 08/22/2021 to 08/23/2022. PA# [employer] - [place] [phone]

## 2021-10-09 ENCOUNTER — Encounter: Payer: Self-pay | Admitting: Emergency Medicine

## 2021-10-09 ENCOUNTER — Ambulatory Visit (INDEPENDENT_AMBULATORY_CARE_PROVIDER_SITE_OTHER): Payer: 59

## 2021-10-09 ENCOUNTER — Other Ambulatory Visit: Payer: Self-pay

## 2021-10-09 ENCOUNTER — Ambulatory Visit
Admission: EM | Admit: 2021-10-09 | Discharge: 2021-10-09 | Disposition: A | Discharge status: Home / Self Care | Payer: 59

## 2021-10-09 DIAGNOSIS — M546 Pain in thoracic spine: Secondary | ICD-10-CM | POA: Diagnosis not present

## 2021-10-09 DIAGNOSIS — R079 Chest pain, unspecified: Secondary | ICD-10-CM | POA: Diagnosis not present

## 2021-10-09 DIAGNOSIS — M549 Dorsalgia, unspecified: Secondary | ICD-10-CM | POA: Diagnosis not present

## 2021-10-09 DIAGNOSIS — R0602 Shortness of breath: Secondary | ICD-10-CM | POA: Diagnosis not present

## 2021-10-09 MED ORDER — DEXAMETHASONE SODIUM PHOSPHATE 10 MG/ML IJ SOLN
10.0000 mg | Freq: Once | INTRAMUSCULAR | Status: AC
  Administered 2021-10-09: 10 mg via INTRAMUSCULAR

## 2021-10-09 NOTE — ED Provider Notes (Signed)
[facility]    CSN: [id number] Arrival date & time: 10/09/21  1318      History   Chief Complaint Chief Complaint  Patient presents with   Shortness of Breath    HPI [name] is a 46 y.o. female.   Pt complains of pain in her upper left back.  Pt reports she has been having some shortness of breath on and off. Pt using albuterol without relief.    The history is provided by the patient. No language interpreter was used.  Shortness of Breath Severity:  Moderate Onset quality:  Gradual Timing:  Constant Chronicity:  New Context: activity   Relieved by:  Nothing Worsened by:  Nothing Ineffective treatments:  None tried Associated symptoms: no abdominal pain and no chest pain   Risk factors: no tobacco use     Past Medical History:  Diagnosis Date   Adverse effect of intravenous anesthetic    Versed   Astigmatism    mild   Chronic back pain greater than 3 months duration    Complication of anesthesia    Cough 12/24/2011   Dysrhythmia    tachycardia controlled with med   GERD (gastroesophageal reflux disease)    Headache(784.0)    migraines   High cholesterol    Hypothyroidism    PONV (postoperative nausea and vomiting)    Postural orthostatic tachycardia syndrome    Snores    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    Patient Active Problem List   Diagnosis Date Noted   Chronic migraine without aura without status migrainosus, not intractable 12/25/2020   Nonallopathic lesion of sacral region 10/13/2017   Nonallopathic lesion of lumbosacral region 10/13/2017   Nonallopathic lesion of thoracic region 10/13/2017   Piriformis syndrome, right 09/15/2017   Peroneal tendinitis of left lower extremity 06/12/2017   Chronic pain of left ankle 05/22/2017   Snoring 02/19/2016   Anxiety disorder 02/19/2016   Chronic insomnia 02/19/2016   Class 3 obesity due to excess calories without serious comorbidity in  adult 02/19/2016   Other and unspecified hyperlipidemia 03/28/2013   Unspecified hypothyroidism 03/28/2013   Chronic headaches 03/28/2013   Insomnia 03/28/2013   Chronic pain of left wrist status post TFCC repair 03/28/2013   BMI 27.0-27.9,adult 03/28/2013   GERD (gastroesophageal reflux disease) 03/28/2013   Smoker 03/28/2013    Past Surgical History:  Procedure Laterality Date   BREAST BIOPSY     CARDIAC CATHETERIZATION  2008   normal   CESAREAN SECTION  05/03/2003; 02/06/2006   DILATION AND CURETTAGE OF UTERUS  05/01/2009   INTRAUTERINE DEVICE INSERTION  05/01/2009   IUD REMOVAL  05/01/2009   LAPAROSCOPIC LYSIS OF ADHESIONS N/A 07/08/2013   Procedure: LAPAROSCOPIC LYSIS OF ADHESIONS;  Surgeon: Jeani Hawking, MD;  Location: [facility];  Service: Gynecology;  Laterality: N/A;   LAPAROSCOPIC TUBAL LIGATION Bilateral 07/08/2013   Procedure: LAPAROSCOPIC TUBAL LIGATION;  Surgeon: Jeani Hawking, MD;  Location: [facility];  Service: Gynecology;  Laterality: Bilateral;   WRIST ARTHROSCOPY  12/31/2011   Procedure: ARTHROSCOPY WRIST;  Surgeon: Wyn Forster., MD;  Location: [facility];  Service: Orthopedics;  Laterality: Left;  Open triangular fibrocartilage complex repair   WRIST SURGERY     approx v2015    OB History     Gravida  6   Para  2   Term  2   Preterm      AB  4  Living  2      SAB  2   IAB  2   Ectopic      Multiple      Live Births               Home Medications    Prior to Admission medications   Medication Sig Start Date End Date Taking? Authorizing Provider  ALPRAZolam Prudy Feeler) 0.5 MG tablet Take 0.5 mg by mouth daily as needed. 05/25/14   [provider]  Botulinum Toxin Type A (BOTOX) 200 units SOLR Provider to inject 155 units into the muscles of the head and neck every 3 months. Discard remainder. 05/02/21   Anson Fret, MD  doxycycline (VIBRA-TABS) 100 MG tablet Take 100 mg by mouth daily. 10/06/21    [provider]  escitalopram (LEXAPRO) 20 MG tablet Take 20 mg by mouth daily.    [provider]  ondansetron (ZOFRAN-ODT) 4 MG disintegrating tablet Take 1-2 tablets (4-8 mg total) by mouth every 8 (eight) hours as needed. 12/25/20   Anson Fret, MD  OVER THE COUNTER MEDICATION Take 1 capsule by mouth daily. Essential Oil    [provider]  ranitidine (ZANTAC) 150 MG tablet Take 150 mg by mouth 2 (two) times daily.    [provider]  Rimegepant Sulfate (NURTEC) 75 MG TBDP Take 75 mg by mouth daily as needed. For migraines. Take as close to onset of migraine as possible. One daily maximum. 08/14/21   Anson Fret, MD  Ubrogepant (UBRELVY) 100 MG TABS Take 100 mg by mouth 2 (two) times daily as needed. 08/14/21   Anson Fret, MD  UNABLE TO FIND Med Name: Trial medication for headaches. Pt is in a clinical trial.    [provider]  valACYclovir (VALTREX) 1000 MG tablet Take 1,000 mg by mouth daily as needed.    [provider]  zolpidem (AMBIEN) 10 MG tablet Take 10 mg by mouth at bedtime as needed.    [provider]    Family History Family History  Problem Relation Age of Onset   Breast cancer Mother    Diabetes Mother    Hypertension Mother    Hypertension Father    Breast cancer Maternal Aunt    Diabetes Paternal Aunt    Hypertension Maternal Grandmother    Hypertension Maternal Grandfather    Hypertension Paternal Grandmother    Diabetes Paternal Grandfather    Hypertension Paternal Grandfather     Social History Social History   Tobacco Use   Smoking status: Every Day    Packs/day: 0.25    Years: 22.00    Total pack years: 5.50    Types: E-cigarettes, Cigarettes   Smokeless tobacco: Never   Tobacco comments:    pt cutting back on her own  Vaping Use   Vaping Use: Former  Substance Use Topics   Alcohol use: Yes    Comment: rarely   Drug use: No     Allergies   Decongest-aid  [pseudoephedrine], Prednisone, Versed [midazolam], and Emgality [galcanezumab-gnlm]   Review of Systems Review of Systems  Respiratory:  Positive for shortness of breath.   Cardiovascular:  Negative for chest pain.  Gastrointestinal:  Negative for abdominal pain.  All other systems reviewed and are negative.    Physical Exam Triage Vital Signs ED Triage Vitals  Enc Vitals Group     BP 10/09/21 1342 126/71     Pulse Rate 10/09/21 1342 75  Resp 10/09/21 1342 18     Temp 10/09/21 1342 98 F (36.7 C)     Temp Source 10/09/21 1342 Oral     SpO2 10/09/21 1342 97 %     Weight --      Height --      Head Circumference --      Peak Flow --      Pain Score 10/09/21 1338 6     Pain Loc --      Pain Edu? --      Excl. in GC? --    No data found.  Updated Vital Signs BP 126/71 (BP Location: Right Arm)   Pulse 75   Temp 98 F (36.7 C) (Oral)   Resp 18   LMP 10/08/2021   SpO2 97%   Visual Acuity Right Eye Distance:   Left Eye Distance:   Bilateral Distance:    Right Eye Near:   Left Eye Near:    Bilateral Near:     Physical Exam Vitals and nursing note reviewed.  Constitutional:      Appearance: She is well-developed.  HENT:     Head: Normocephalic.  Cardiovascular:     Rate and Rhythm: Normal rate and regular rhythm.  Pulmonary:     Effort: Pulmonary effort is normal.     Breath sounds: Normal breath sounds.  Abdominal:     General: There is no distension.     Palpations: Abdomen is soft.  Musculoskeletal:        General: Normal range of motion.     Cervical back: Normal range of motion.  Skin:    General: Skin is warm.  Neurological:     General: No focal deficit present.     Mental Status: She is alert and oriented to person, place, and time.      UC Treatments / Results  Labs (all labs ordered are listed, but only abnormal results are displayed) Labs Reviewed - No data to display  EKG   Radiology DG Chest 2 View  Result Date:  10/09/2021 CLINICAL DATA:  Upper left back pain and shortness of breath. EXAM: CHEST - 2 VIEW COMPARISON:  None Available. FINDINGS: The heart size and mediastinal contours are within normal limits. Both lungs are clear. The visualized skeletal structures are unremarkable. IMPRESSION: No active cardiopulmonary disease. Electronically Signed   By: Signa Kell M.D.   On: 10/09/2021 14:37    Procedures Procedures (including critical care time)  Medications Ordered in UC Medications - No data to display  Initial Impression / Assessment and Plan / UC Course  I have reviewed the triage vital signs and the nursing notes.  Pertinent labs & imaging results that were available during my care of the patient were reviewed by me and considered in my medical decision making (see chart for details).      Final Clinical Impressions(s) / UC Diagnoses   Final diagnoses:  Acute thoracic back pain, unspecified back pain laterality  Shortness of breath   Discharge Instructions   None    ED Prescriptions   None    PDMP not reviewed this encounter.   Elson Areas, [place] 10/09/21 2023

## 2021-10-09 NOTE — ED Triage Notes (Signed)
Onset Friday of sob, pain in left upper back/shoulder.  Denies productive cough, patient has a stuffy nose ( not new).

## 2021-10-26 ENCOUNTER — Ambulatory Visit
Admission: RE | Admit: 2021-10-26 | Discharge: 2021-10-26 | Disposition: A | Discharge status: Home / Self Care | Payer: 59 | Source: Ambulatory Visit | Attending: Internal Medicine | Admitting: Internal Medicine

## 2021-10-26 DIAGNOSIS — E785 Hyperlipidemia, unspecified: Secondary | ICD-10-CM

## 2021-11-06 NOTE — Telephone Encounter (Addendum)
[employer] PA form for Botox continuation (start date 11/19/21) completed and is pending MD signature. I spoke with [employer] on the phone. This is B/B under medical benefit. Only one PA is needed.

## 2021-11-07 NOTE — Telephone Encounter (Signed)
Monia Pouch PA has been signed by Dr Lucia Gaskins and then faxed to [facility]. Received a receipt of confirmation.

## 2021-11-13 ENCOUNTER — Encounter: Payer: Self-pay | Admitting: *Deleted

## 2021-11-13 ENCOUNTER — Ambulatory Visit: Payer: 59 | Admitting: Neurology

## 2021-11-13 NOTE — Telephone Encounter (Signed)
Appeal letter signed by Dr Lucia Gaskins. Appeal, denial letter, office notes all faxed to [facility], [facility] [phone]. Received a receipt of confirmation.-

## 2021-11-13 NOTE — Telephone Encounter (Signed)
Received a fax from [employer] stating Botox 200 UNIT INJ has been denied due to "drug not covered/plan exclusion- your request for coverage was denied because your prescription benefit plan does not cover the requested medication". Appeal can be faxed to [phone]. PA # [facility] Exchange - [place] [phone] BP.    I called [employer] pre-cert @ [phone] and was transferred to [facility] (Reference # [id number]) who said I needed to speak with the pharmacy intake department [phone]. They open at 9 AM. She also was able to confirm that this PA is done under medical benefit through pharmacy and only one PA is needed for both drug and procedure.

## 2021-11-13 NOTE — Telephone Encounter (Addendum)
I called [facility] pharmacy intake dept and spoke with Eunice Blase and was told they do not handle the PA for the Botox but that I needed to call [phone]. When I called this number it was [facility] and I spoke with Fabio Neighbors (pharmacy tech) who said the plan has gotten stricter with drug and this denial needs to be appealed. Expedited fax number is [phone].   Appeal letter written and is pending MD signature. Ready to fax with past office notes.

## 2021-12-19 NOTE — Telephone Encounter (Signed)
I was unable to get in touch with anyone directly in the [facility] appeals department. I spoke with Joanell Rising from [employer] [phone] and she was able to complete an IBR (internal benefits review) request to determine medical necessity and she said they would get back in touch with [place] within 24-48 hours. She gave a PA number of [id number]. Botox is not a covered benefit but hopefully they will allow a plan exclusion.

## 2021-12-31 ENCOUNTER — Other Ambulatory Visit (HOSPITAL_COMMUNITY): Payer: Self-pay

## 2021-12-31 ENCOUNTER — Telehealth: Payer: Self-pay | Admitting: Pharmacy Technician

## 2021-12-31 DIAGNOSIS — G43709 Chronic migraine without aura, not intractable, without status migrainosus: Secondary | ICD-10-CM

## 2021-12-31 NOTE — Telephone Encounter (Signed)
Chronic Migraine CPT 64615  Botox J0585 Units:200  G43.709 Chronic Migraine without aura, not intractable, without status migrainous  Could the PA team look into this? I tried to do a routine Botox renewal and it turned into an appeal and then an IBR which I have not heard back about.

## 2021-12-31 NOTE — Telephone Encounter (Signed)
Patient Advocate Encounter   Received notification that prior authorization for Botox 200UNIT solution is required.   PA submitted on 12/31/2021 Key [id number] Status is pending       Lyndel Safe, [place] Patient Advocate Specialist [facility] Patient Advocate Team Direct Number: [phone]  Fax: [phone]

## 2022-01-01 ENCOUNTER — Other Ambulatory Visit: Payer: BLUE CROSS/BLUE SHIELD

## 2022-01-02 NOTE — Telephone Encounter (Signed)
BotoxOne Benefit Verification has been submitted.  Key:  [name]

## 2022-01-17 ENCOUNTER — Other Ambulatory Visit (HOSPITAL_COMMUNITY): Payer: Self-pay

## 2022-01-22 ENCOUNTER — Other Ambulatory Visit (HOSPITAL_COMMUNITY): Payer: Self-pay

## 2022-01-22 NOTE — Telephone Encounter (Signed)
[employer] they said they did not receive Fax for Prior Authorization.  Faxed again today

## 2022-01-31 DIAGNOSIS — Z1231 Encounter for screening mammogram for malignant neoplasm of breast: Secondary | ICD-10-CM | POA: Diagnosis not present

## 2022-01-31 DIAGNOSIS — Z6831 Body mass index (BMI) 31.0-31.9, adult: Secondary | ICD-10-CM | POA: Diagnosis not present

## 2022-01-31 DIAGNOSIS — Z124 Encounter for screening for malignant neoplasm of cervix: Secondary | ICD-10-CM | POA: Diagnosis not present

## 2022-01-31 DIAGNOSIS — Z01419 Encounter for gynecological examination (general) (routine) without abnormal findings: Secondary | ICD-10-CM | POA: Diagnosis not present

## 2022-02-04 ENCOUNTER — Other Ambulatory Visit: Payer: Self-pay | Admitting: Obstetrics and Gynecology

## 2022-02-04 DIAGNOSIS — R928 Other abnormal and inconclusive findings on diagnostic imaging of breast: Secondary | ICD-10-CM

## 2022-02-13 NOTE — Telephone Encounter (Addendum)
We received a fax from [place] stating Botox has been denied because it is excluded from coverage under the terms of the member's plan. If appeal desired and it is urgent, call [phone] or fax request to [phone]. Case #[id number]

## 2022-02-19 NOTE — Telephone Encounter (Signed)
Spoke with Trey Paula in pharmacy. He will try PA with [facility] as rendering provider.

## 2022-02-22 DIAGNOSIS — Z419 Encounter for procedure for purposes other than remedying health state, unspecified: Secondary | ICD-10-CM | POA: Diagnosis not present

## 2022-02-22 NOTE — Telephone Encounter (Signed)
Patient Advocate Encounter  Prior Authorization for Botox 200UNIT solution has been approved.     Effective dates: 02/22/2022 through 02/22/2023      [name], CPhT Pharmacy Patient Advocate Specialist [facility] Patient Advocate Team Direct Number: [phone]  Fax: [phone]

## 2022-02-26 ENCOUNTER — Other Ambulatory Visit (HOSPITAL_COMMUNITY): Payer: Self-pay

## 2022-02-26 NOTE — Addendum Note (Signed)
Addended by: Bertram Savin on: 02/26/2022 12:57 PM   Modules accepted: Orders

## 2022-02-26 NOTE — Telephone Encounter (Signed)
[facility] phone [phone] Fax [phone]   [phone] does not require PA per benefit review chart below

## 2022-02-27 ENCOUNTER — Other Ambulatory Visit (HOSPITAL_COMMUNITY): Payer: Self-pay

## 2022-02-27 NOTE — Telephone Encounter (Signed)
Attempted to send Botox Rx to [facility] but this is not listed in the system and the number is invalid when attempting to call. Is there another SP we can use? [employer] SP was just denied as being out of network.

## 2022-02-28 ENCOUNTER — Other Ambulatory Visit (HOSPITAL_COMMUNITY): Payer: Self-pay

## 2022-03-07 ENCOUNTER — Ambulatory Visit
Admission: RE | Admit: 2022-03-07 | Discharge: 2022-03-07 | Disposition: A | Discharge status: Home / Self Care | Payer: BLUE CROSS/BLUE SHIELD | Source: Ambulatory Visit | Attending: Obstetrics and Gynecology | Admitting: Obstetrics and Gynecology

## 2022-03-07 ENCOUNTER — Ambulatory Visit
Admission: RE | Admit: 2022-03-07 | Discharge: 2022-03-07 | Disposition: A | Discharge status: Home / Self Care | Payer: 59 | Source: Ambulatory Visit | Attending: Obstetrics and Gynecology | Admitting: Obstetrics and Gynecology

## 2022-03-07 DIAGNOSIS — R928 Other abnormal and inconclusive findings on diagnostic imaging of breast: Secondary | ICD-10-CM | POA: Diagnosis not present

## 2022-03-07 NOTE — Telephone Encounter (Signed)
BotoxOne Benefit Verification [id number] Submitted

## 2022-03-08 NOTE — Telephone Encounter (Signed)
She is approved for [employer] and [employer]

## 2022-03-11 NOTE — Telephone Encounter (Signed)
Wonderful, thanks. Will see if our team call patient and schedule her with any NP (not Dr Lucia Gaskins) first available appt for Botox buy and bill. Tell her we are sorry for the delay but there was some difficulty hearing back from her insurance. We finally have approval.

## 2022-03-11 NOTE — Telephone Encounter (Signed)
Called pt to inform. LVM for her to call back and schedule appointment.

## 2022-03-25 DIAGNOSIS — Z419 Encounter for procedure for purposes other than remedying health state, unspecified: Secondary | ICD-10-CM | POA: Diagnosis not present

## 2022-03-27 NOTE — Telephone Encounter (Signed)
Left another message asking pt to call back and schedule Botox appt.

## 2022-04-25 DIAGNOSIS — Z419 Encounter for procedure for purposes other than remedying health state, unspecified: Secondary | ICD-10-CM | POA: Diagnosis not present

## 2022-05-24 DIAGNOSIS — Z419 Encounter for procedure for purposes other than remedying health state, unspecified: Secondary | ICD-10-CM | POA: Diagnosis not present

## 2022-06-24 DIAGNOSIS — Z419 Encounter for procedure for purposes other than remedying health state, unspecified: Secondary | ICD-10-CM | POA: Diagnosis not present

## 2022-07-24 DIAGNOSIS — Z419 Encounter for procedure for purposes other than remedying health state, unspecified: Secondary | ICD-10-CM | POA: Diagnosis not present

## 2022-08-13 DIAGNOSIS — K219 Gastro-esophageal reflux disease without esophagitis: Secondary | ICD-10-CM | POA: Diagnosis not present

## 2022-08-13 DIAGNOSIS — R7301 Impaired fasting glucose: Secondary | ICD-10-CM | POA: Diagnosis not present

## 2022-08-13 DIAGNOSIS — E785 Hyperlipidemia, unspecified: Secondary | ICD-10-CM | POA: Diagnosis not present

## 2022-08-13 DIAGNOSIS — E039 Hypothyroidism, unspecified: Secondary | ICD-10-CM | POA: Diagnosis not present

## 2022-08-20 DIAGNOSIS — F3342 Major depressive disorder, recurrent, in full remission: Secondary | ICD-10-CM | POA: Diagnosis not present

## 2022-08-20 DIAGNOSIS — Z1331 Encounter for screening for depression: Secondary | ICD-10-CM | POA: Diagnosis not present

## 2022-08-20 DIAGNOSIS — M797 Fibromyalgia: Secondary | ICD-10-CM | POA: Diagnosis not present

## 2022-08-20 DIAGNOSIS — F419 Anxiety disorder, unspecified: Secondary | ICD-10-CM | POA: Diagnosis not present

## 2022-08-20 DIAGNOSIS — F17211 Nicotine dependence, cigarettes, in remission: Secondary | ICD-10-CM | POA: Diagnosis not present

## 2022-08-20 DIAGNOSIS — G43909 Migraine, unspecified, not intractable, without status migrainosus: Secondary | ICD-10-CM | POA: Diagnosis not present

## 2022-08-20 DIAGNOSIS — E785 Hyperlipidemia, unspecified: Secondary | ICD-10-CM | POA: Diagnosis not present

## 2022-08-20 DIAGNOSIS — Z0000 Encounter for general adult medical examination without abnormal findings: Secondary | ICD-10-CM | POA: Diagnosis not present

## 2022-08-20 DIAGNOSIS — G4733 Obstructive sleep apnea (adult) (pediatric): Secondary | ICD-10-CM | POA: Diagnosis not present

## 2022-08-20 DIAGNOSIS — Z1339 Encounter for screening examination for other mental health and behavioral disorders: Secondary | ICD-10-CM | POA: Diagnosis not present

## 2022-08-20 DIAGNOSIS — M546 Pain in thoracic spine: Secondary | ICD-10-CM | POA: Diagnosis not present

## 2022-08-20 DIAGNOSIS — E039 Hypothyroidism, unspecified: Secondary | ICD-10-CM | POA: Diagnosis not present

## 2022-08-20 DIAGNOSIS — R7301 Impaired fasting glucose: Secondary | ICD-10-CM | POA: Diagnosis not present

## 2022-08-24 DIAGNOSIS — Z419 Encounter for procedure for purposes other than remedying health state, unspecified: Secondary | ICD-10-CM | POA: Diagnosis not present

## 2022-09-23 DIAGNOSIS — Z419 Encounter for procedure for purposes other than remedying health state, unspecified: Secondary | ICD-10-CM | POA: Diagnosis not present

## 2022-10-24 DIAGNOSIS — Z419 Encounter for procedure for purposes other than remedying health state, unspecified: Secondary | ICD-10-CM | POA: Diagnosis not present

## 2022-11-24 DIAGNOSIS — Z419 Encounter for procedure for purposes other than remedying health state, unspecified: Secondary | ICD-10-CM | POA: Diagnosis not present

## 2022-11-27 DIAGNOSIS — E785 Hyperlipidemia, unspecified: Secondary | ICD-10-CM | POA: Diagnosis not present

## 2022-11-27 DIAGNOSIS — R7301 Impaired fasting glucose: Secondary | ICD-10-CM | POA: Diagnosis not present

## 2022-11-27 DIAGNOSIS — G43909 Migraine, unspecified, not intractable, without status migrainosus: Secondary | ICD-10-CM | POA: Diagnosis not present

## 2022-11-27 DIAGNOSIS — F3342 Major depressive disorder, recurrent, in full remission: Secondary | ICD-10-CM | POA: Diagnosis not present

## 2022-11-27 DIAGNOSIS — F419 Anxiety disorder, unspecified: Secondary | ICD-10-CM | POA: Diagnosis not present

## 2022-11-27 DIAGNOSIS — G4700 Insomnia, unspecified: Secondary | ICD-10-CM | POA: Diagnosis not present

## 2022-11-27 DIAGNOSIS — G4733 Obstructive sleep apnea (adult) (pediatric): Secondary | ICD-10-CM | POA: Diagnosis not present

## 2022-11-27 DIAGNOSIS — M797 Fibromyalgia: Secondary | ICD-10-CM | POA: Diagnosis not present

## 2022-11-27 DIAGNOSIS — M546 Pain in thoracic spine: Secondary | ICD-10-CM | POA: Diagnosis not present

## 2022-11-27 DIAGNOSIS — J019 Acute sinusitis, unspecified: Secondary | ICD-10-CM | POA: Diagnosis not present

## 2022-11-27 DIAGNOSIS — E039 Hypothyroidism, unspecified: Secondary | ICD-10-CM | POA: Diagnosis not present

## 2023-01-24 DIAGNOSIS — Z419 Encounter for procedure for purposes other than remedying health state, unspecified: Secondary | ICD-10-CM | POA: Diagnosis not present

## 2023-02-23 DIAGNOSIS — Z419 Encounter for procedure for purposes other than remedying health state, unspecified: Secondary | ICD-10-CM | POA: Diagnosis not present

## 2023-03-26 DIAGNOSIS — Z419 Encounter for procedure for purposes other than remedying health state, unspecified: Secondary | ICD-10-CM | POA: Diagnosis not present

## 2023-04-26 DIAGNOSIS — Z419 Encounter for procedure for purposes other than remedying health state, unspecified: Secondary | ICD-10-CM | POA: Diagnosis not present

## 2023-05-24 DIAGNOSIS — Z419 Encounter for procedure for purposes other than remedying health state, unspecified: Secondary | ICD-10-CM | POA: Diagnosis not present

## 2023-07-05 DIAGNOSIS — Z419 Encounter for procedure for purposes other than remedying health state, unspecified: Secondary | ICD-10-CM | POA: Diagnosis not present

## 2023-10-02 ENCOUNTER — Ambulatory Visit: Admitting: Plastic Surgery

## 2023-10-02 VITALS — BP 124/84 | HR 104 | Ht 62.0 in | Wt 164.0 lb

## 2023-10-02 DIAGNOSIS — Z6830 Body mass index (BMI) 30.0-30.9, adult: Secondary | ICD-10-CM

## 2023-10-02 DIAGNOSIS — G5701 Lesion of sciatic nerve, right lower limb: Secondary | ICD-10-CM

## 2023-10-02 DIAGNOSIS — N62 Hypertrophy of breast: Secondary | ICD-10-CM | POA: Diagnosis not present

## 2023-10-02 DIAGNOSIS — M542 Cervicalgia: Secondary | ICD-10-CM

## 2023-10-02 DIAGNOSIS — M549 Dorsalgia, unspecified: Secondary | ICD-10-CM | POA: Insufficient documentation

## 2023-10-02 DIAGNOSIS — Z803 Family history of malignant neoplasm of breast: Secondary | ICD-10-CM

## 2023-10-02 DIAGNOSIS — M546 Pain in thoracic spine: Secondary | ICD-10-CM

## 2023-10-02 DIAGNOSIS — G8929 Other chronic pain: Secondary | ICD-10-CM

## 2023-10-02 DIAGNOSIS — F419 Anxiety disorder, unspecified: Secondary | ICD-10-CM

## 2023-10-02 NOTE — Progress Notes (Signed)
 Patient ID: [name], female    DOB: 1975-06-12, 48 y.o.   MRN: [id number]   Chief Complaint  Patient presents with   consult    Mammary Hyperplasia: The patient is a 48 y.o. female with a history of mammary hyperplasia for several years.  She has extremely large breasts causing symptoms that include the following: Back pain in the upper and lower back, including neck pain. She pulls or pins her bra straps to provide better lift and relief of the pressure and pain. She notices relief by holding her breast up manually.  Her shoulder straps cause grooves and pain and pressure that requires padding for relief. Pain medication is sometimes required with motrin  and tylenol .  Activities that are hindered by enlarged breasts include: exercise and running.  She has tried supportive clothing as well as fitted bras without improvement.  Her breasts are extremely large and fairly symmetric.  She has hyperpigmentation of the inframammary area on both sides.  The sternal to nipple distance on the right is 35 cm and the left is 32 cm.  The IMF distance is 15 cm.  She is 5 feet 2 inches tall and weighs 164 pounds.  The BMI = 30 kg/m.  Preoperative bra size = DDDD/E cup.  Would like to be a B or a C cup size.  The estimated excess breast tissue to be removed at the time of surgery = 400-425 grams on the left and 400-425 grams on the right.  Mammogram history: April 2025 was negative.  She had biopsy done in the past from the left breast in 2016 but it came back negative.  She does have dense breasts.  Family history of breast cancer:  mom, maternal aunt.  Tobacco use:  no.   The patient expresses the desire to pursue surgical intervention. She has done physical therapy for about a year due to back pain.     Review of Systems  Constitutional:  Positive for activity change. Negative for appetite change.  Eyes: Negative.   Respiratory: Negative.  Negative for chest tightness.   Cardiovascular:  Negative.   Gastrointestinal: Negative.   Endocrine: Negative.   Genitourinary: Negative.   Musculoskeletal:  Positive for back pain and neck pain.  Skin:  Positive for rash.    Past Medical History:  Diagnosis Date   Adverse effect of intravenous anesthetic    Versed   Astigmatism    mild   Chronic back pain greater than 3 months duration    Complication of anesthesia    Cough 12/24/2011   Dysrhythmia    tachycardia controlled with med   GERD (gastroesophageal reflux disease)    Headache(784.0)    migraines   High cholesterol    Hypothyroidism    PONV (postoperative nausea and vomiting)    Postural orthostatic tachycardia syndrome    Snores    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    Past Surgical History:  Procedure Laterality Date   BREAST BIOPSY     CARDIAC CATHETERIZATION  2008   normal   CESAREAN SECTION  05/03/2003; 02/06/2006   DILATION AND CURETTAGE OF UTERUS  05/01/2009   INTRAUTERINE DEVICE INSERTION  05/01/2009   IUD REMOVAL  05/01/2009   LAPAROSCOPIC LYSIS OF ADHESIONS N/A 07/08/2013   Procedure: LAPAROSCOPIC LYSIS OF ADHESIONS;  Surgeon: Rosaline LITTIE Cobble, MD;  Location: [facility];  Service: Gynecology;  Laterality: N/A;   LAPAROSCOPIC TUBAL LIGATION Bilateral 07/08/2013  Procedure: LAPAROSCOPIC TUBAL LIGATION;  Surgeon: Rosaline LITTIE Cobble, MD;  Location: [facility];  Service: Gynecology;  Laterality: Bilateral;   WRIST ARTHROSCOPY  12/31/2011   Procedure: ARTHROSCOPY WRIST;  Surgeon: Lamar LULLA Leonor Mickey., MD;  Location: [facility];  Service: Orthopedics;  Laterality: Left;  Open triangular fibrocartilage complex repair   WRIST SURGERY     approx v2015      Current Outpatient Medications:    ALPRAZolam (XANAX) 0.5 MG tablet, Take 0.5 mg by mouth daily as needed., Disp: , Rfl:    doxycycline (VIBRA-TABS) 100 MG tablet, Take 100 mg by mouth daily., Disp: , Rfl:    escitalopram (LEXAPRO) 20 MG tablet, Take 20 mg  by mouth daily., Disp: , Rfl:    methocarbamol (ROBAXIN) 750 MG tablet, Take 750 mg by mouth 3 (three) times daily., Disp: , Rfl:    ondansetron  (ZOFRAN -ODT) 4 MG disintegrating tablet, Take 1-2 tablets (4-8 mg total) by mouth every 8 (eight) hours as needed., Disp: 30 tablet, Rfl: 3   OVER THE COUNTER MEDICATION, Take 1 capsule by mouth daily. Essential Oil, Disp: , Rfl:    progesterone (PROMETRIUM) 100 MG capsule, TAKE 1 CAPSULE BY MOUTH EVERY DAY FOR 90 DAYS, Disp: , Rfl:    ranitidine (ZANTAC) 150 MG tablet, Take 150 mg by mouth 2 (two) times daily., Disp: , Rfl:    Ubrogepant  (UBRELVY ) 100 MG TABS, Take 100 mg by mouth 2 (two) times daily as needed., Disp: 16 tablet, Rfl: 11   valACYclovir (VALTREX) 1000 MG tablet, Take 1,000 mg by mouth daily as needed., Disp: , Rfl:    ZEPBOUND 5 MG/0.5ML Pen, SMARTSIG:5 Milligram(s) SUB-Q Once a Week, Disp: , Rfl:    zolpidem (AMBIEN) 10 MG tablet, Take 10 mg by mouth at bedtime as needed., Disp: , Rfl:    Botulinum Toxin Type A  (BOTOX ) 200 units SOLR, Provider to inject 155 units into the muscles of the head and neck every 3 months. Discard remainder. (Patient not taking: Reported on 10/02/2023), Disp: 1 each, Rfl: 3   Rimegepant Sulfate (NURTEC) 75 MG TBDP, Take 75 mg by mouth daily as needed. For migraines. Take as close to onset of migraine as possible. One daily maximum., Disp: 8 tablet, Rfl: 11   UNABLE TO FIND, Med Name: Trial medication for headaches. Pt is in a clinical trial., Disp: , Rfl:    Objective:   Vitals:   10/02/23 1157  BP: 124/84  Pulse: (!) 104  SpO2: 96%    Physical Exam Vitals reviewed.  Constitutional:      Appearance: Normal appearance.  HENT:     Head: Atraumatic.  Cardiovascular:     Rate and Rhythm: Normal rate.     Pulses: Normal pulses.  Pulmonary:     Effort: Pulmonary effort is normal.  Abdominal:     Palpations: Abdomen is soft.  Skin:    General: Skin is warm.     Capillary Refill: Capillary refill takes  less than 2 seconds.  Neurological:     Mental Status: She is alert and oriented to person, place, and time.  Psychiatric:        Mood and Affect: Mood normal.        Behavior: Behavior normal.        Thought Content: Thought content normal.        Judgment: Judgment normal.     Assessment & Plan:  Anxiety disorder, unspecified type  Piriformis syndrome, right  Chronic bilateral thoracic back  pain  Symptomatic mammary hypertrophy  The procedure the patient selected and that was best for the patient was discussed. The risk were discussed and include but not limited to the following:  Breast asymmetry, fluid accumulation, firmness of the breast, inability to breast feed, loss of nipple or areola, skin loss, change in skin and nipple sensation, fat necrosis of the breast tissue, bleeding, infection and healing delay.  There are risks of anesthesia and injury to nerves or blood vessels.  Allergic reaction to tape, suture and skin glue are possible.  There will be swelling.  Any of these can lead to the need for revisional surgery which is not included in this surgery.  A breast reduction has potential to interfere with diagnostic procedures in the future.  This procedure is best done when the breast is fully developed.  Changes in the breast will continue to occur over time: pregnancy, weight gain or weigh loss. No guarantees are given for a certain bra or breast size.    Total time: 40 minutes. This includes time spent with the patient during the visit as well as time spent before and after the visit reviewing the chart, documenting the encounter, ordering pertinent studies and literature for the patient.   Physical therapy: Not required Mammogram: Up-to-date  The patient is a good candidate for bilateral breast reduction with liposuction.  She understands that I cannot guarantee her a certain bra size.  Pictures were obtained of the patient and placed in the chart with the patient's or  guardian's permission.   Estefana RAMAN Myron Lona, DO

## 2023-12-30 ENCOUNTER — Telehealth: Payer: Self-pay

## 2023-12-30 NOTE — Telephone Encounter (Signed)
 Called and left voicemail for patient to reschedule appointment on 11/11 with Dr Ines.  If patient calls back, they can be rescheduled with Dr Onita

## 2024-01-02 ENCOUNTER — Encounter: Admitting: Surgical

## 2024-01-22 ENCOUNTER — Encounter: Admitting: Surgical

## 2024-02-03 ENCOUNTER — Encounter: Admitting: Plastic Surgery

## 2024-02-03 ENCOUNTER — Ambulatory Visit: Admitting: Neurology

## 2024-02-03 ENCOUNTER — Telehealth: Payer: Self-pay | Admitting: Surgical

## 2024-02-03 NOTE — Telephone Encounter (Signed)
 called 02-03-24 lvmail Called on 11-7 & 11-10, left a VM //PreOP: Bil reduction / Ins / Sx 03-10-2024 [facility] 1100

## 2024-02-13 ENCOUNTER — Encounter: Admitting: Surgical

## 2024-02-17 ENCOUNTER — Encounter: Admitting: Surgical

## 2024-02-20 ENCOUNTER — Ambulatory Visit: Admitting: Physician Assistant

## 2024-02-20 VITALS — BP 109/76 | HR 100

## 2024-02-20 DIAGNOSIS — N62 Hypertrophy of breast: Secondary | ICD-10-CM

## 2024-02-20 DIAGNOSIS — Z720 Tobacco use: Secondary | ICD-10-CM | POA: Diagnosis not present

## 2024-02-20 NOTE — Progress Notes (Signed)
 Patient ID: [name], female    DOB: [birth year], 48 y.o.   MRN: [id number]  Chief Complaint  Patient presents with   Pre-op Exam      ICD-10-CM   1. Symptomatic mammary hypertrophy  N62        History of Present Illness: [name] is a 48 y.o.  female  with a history of macromastia.  She presents for preoperative evaluation for upcoming procedure, bilateral breast reduction possible liposuction, scheduled for 03/10/2024 with Dr. Lowery.  The patient has not had problems with anesthesia.  She denies any personal or family history of blood clots or clotting disorder.  She denies any personal history of severe cardiac or pulmonary disease, cancer, or inflammatory bowel disease.  She does endorse periodically vaping nicotine containing products, as recently as yesterday.  Patient understands that for elective surgeries such as breast reduction, nicotine abstinence for a minimum of 6 weeks prior to surgery is typically warranted.  She states that she rarely ever does that and can easily stop.  That said, she also has additional questions regarding what to expect from breast reduction surgery including how her axillary regions will be addressed.  She states that she has lost a lot of weight and the extra skin/folds in the axilla bilaterally are a large concern for her.  Will schedule a telephone visit with Dr. Lowery next week for additional consult and to discuss possibly postponing surgery due to the nicotine use.  Patient understands that she would need to hold the Zepbound 1 week prior to surgery.  She does endorse history of varicosities.  Summary of Previous Visit: Patient met with Dr. Lowery for initial consult 10/02/2023.  At that time, preoperative bra size equals DDD/E cup.  She expressed that she wanted to be a B or C cup.  STN 35 cm on the right, 32 cm on the left.  Estimated tissue to be removed at time of surgery equals 400 to 425 g each side.  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  Discussed time off needed for restrictions such as lifting and she suspects that she can delegate physically demanding aspects of her job and be fine with only 2 weeks FMLA/STD.  PMH Significant for: Macromastia, migraine disorder, weight loss on Zepbound, anxiety, former nicotine use.   Past Medical History: Allergies: Allergies  Allergen Reactions   Decongest-Aid [Pseudoephedrine] Other (See Comments)    TACHYCARDIA   Prednisone Other (See Comments)    TACHYCARDIA   Versed [Midazolam] Other (See Comments)    SEVERE ANGER REACTION   Emgality [Galcanezumab-Gnlm]     Injection site reaction    Current Medications:  Current Outpatient Medications:    ALPRAZolam (XANAX) 0.5 MG tablet, Take 0.5 mg by mouth daily as needed., Disp: , Rfl:    doxycycline (VIBRA-TABS) 100 MG tablet, Take 100 mg by mouth daily., Disp: , Rfl:    escitalopram (LEXAPRO) 20 MG tablet, Take 20 mg by mouth daily., Disp: , Rfl:    methocarbamol (ROBAXIN) 750 MG tablet, Take 750 mg by mouth 3 (three) times daily., Disp: , Rfl:    ondansetron  (ZOFRAN -ODT) 4 MG disintegrating tablet, Take 1-2 tablets (4-8 mg total) by mouth every 8 (eight) hours as needed., Disp: 30 tablet, Rfl: 3   OVER THE COUNTER MEDICATION, Take 1 capsule by mouth daily. Essential Oil, Disp: , Rfl:    progesterone (PROMETRIUM) 100 MG capsule, TAKE 1  CAPSULE BY MOUTH EVERY DAY FOR 90 DAYS, Disp: , Rfl:    ranitidine (ZANTAC) 150 MG tablet, Take 150 mg by mouth 2 (two) times daily., Disp: , Rfl:    Ubrogepant  (UBRELVY ) 100 MG TABS, Take 100 mg by mouth 2 (two) times daily as needed., Disp: 16 tablet, Rfl: 11   UNABLE TO FIND, Med Name: Trial medication for headaches. Pt is in a clinical trial., Disp: , Rfl:    valACYclovir (VALTREX) 1000 MG tablet, Take 1,000 mg by mouth daily as needed., Disp: , Rfl:    ZEPBOUND 5 MG/0.5ML Pen, SMARTSIG:5 Milligram(s) SUB-Q Once a Week, Disp:  , Rfl:    zolpidem (AMBIEN) 10 MG tablet, Take 10 mg by mouth at bedtime as needed., Disp: , Rfl:    Botulinum Toxin Type A  (BOTOX ) 200 units SOLR, Provider to inject 155 units into the muscles of the head and neck every 3 months. Discard remainder. (Patient not taking: Reported on 02/20/2024), Disp: 1 each, Rfl: 3   Rimegepant Sulfate (NURTEC) 75 MG TBDP, Take 75 mg by mouth daily as needed. For migraines. Take as close to onset of migraine as possible. One daily maximum. (Patient not taking: Reported on 02/20/2024), Disp: 8 tablet, Rfl: 11  Past Medical Problems: Past Medical History:  Diagnosis Date   Adverse effect of intravenous anesthetic    Versed   Astigmatism    mild   Chronic back pain greater than 3 months duration    Complication of anesthesia    Cough 12/24/2011   Dysrhythmia    tachycardia controlled with med   GERD (gastroesophageal reflux disease)    Headache(784.0)    migraines   High cholesterol    Hypothyroidism    PONV (postoperative nausea and vomiting)    Postural orthostatic tachycardia syndrome    Snores    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    Past Surgical History: Past Surgical History:  Procedure Laterality Date   BREAST BIOPSY     CARDIAC CATHETERIZATION  2008   normal   CESAREAN SECTION  05/03/2003; 02/06/2006   DILATION AND CURETTAGE OF UTERUS  05/01/2009   INTRAUTERINE DEVICE INSERTION  05/01/2009   IUD REMOVAL  05/01/2009   LAPAROSCOPIC LYSIS OF ADHESIONS N/A 07/08/2013   Procedure: LAPAROSCOPIC LYSIS OF ADHESIONS;  Surgeon: Rosaline LITTIE Cobble, MD;  Location: [facility];  Service: Gynecology;  Laterality: N/A;   LAPAROSCOPIC TUBAL LIGATION Bilateral 07/08/2013   Procedure: LAPAROSCOPIC TUBAL LIGATION;  Surgeon: Rosaline LITTIE Cobble, MD;  Location: [facility];  Service: Gynecology;  Laterality: Bilateral;   WRIST ARTHROSCOPY  12/31/2011   Procedure: ARTHROSCOPY WRIST;  Surgeon: Lamar LULLA Leonor Mickey., MD;  Location:  [facility];  Service: Orthopedics;  Laterality: Left;  Open triangular fibrocartilage complex repair   WRIST SURGERY     approx v2015    Social History: Social History   Socioeconomic History   Marital status: Married    Spouse name: Not on file   Number of children: Not on file   Years of education: Not on file   Highest education level: Not on file  Occupational History   Not on file  Tobacco Use   Smoking status: Every Day    Current packs/day: 0.25    Average packs/day: 0.3 packs/day for 22.0 years (5.5 ttl pk-yrs)    Types: E-cigarettes, Cigarettes   Smokeless tobacco: Never   Tobacco comments:    pt cutting back on her own  Vaping  Use   Vaping status: Former  Substance and Sexual Activity   Alcohol  use: Yes    Comment: rarely   Drug use: No   Sexual activity: Yes    Birth control/protection: Surgical  Other Topics Concern   Not on file  Social History Narrative   Not on file   Social Drivers of Health   Financial Resource Strain: Not on file  Food Insecurity: Not on file  Transportation Needs: Not on file  Physical Activity: Not on file  Stress: Not on file  Social Connections: Not on file  Intimate Partner Violence: Not on file    Family History: Family History  Problem Relation Age of Onset   Breast cancer Mother    Diabetes Mother    Hypertension Mother    Hypertension Father    Breast cancer Maternal Aunt    Diabetes Paternal Aunt    Hypertension Maternal Grandmother    Hypertension Maternal Grandfather    Hypertension Paternal Grandmother    Diabetes Paternal Grandfather    Hypertension Paternal Grandfather     Review of Systems: ROS Denies any recent chest pain, difficult breathing, leg swelling, fevers  Physical Exam: Vital Signs BP 109/76 (BP Location: Left Arm, Patient Position: Sitting, Cuff Size: Large)   Pulse 100   SpO2 100%   Physical Exam Constitutional:      General: Not in acute distress.    Appearance:  Normal appearance. Not ill-appearing.  HENT:     Head: Normocephalic and atraumatic.  Eyes:     Pupils: Pupils are equal, round. Cardiovascular:     Rate and Rhythm: Normal rate.    Pulses: Normal pulses.  Pulmonary:     Effort: No respiratory distress or increased work of breathing.  Speaks in full sentences. Abdominal:     General: Abdomen is flat. No distension.   Musculoskeletal: Normal range of motion. No lower extremity swelling or edema.  Skin:    General: Skin is warm and dry.     Findings: No erythema or rash.  Neurological:     Mental Status: Alert and oriented to person, place, and time.  Psychiatric:        Mood and Affect: Mood normal.        Behavior: Behavior normal.    Assessment/Plan: The patient is scheduled for bilateral breast reduction with possible liposuction with Dr. Lowery.  Risks, benefits, and alternatives of procedure discussed, questions answered and consent obtained.    Smoking Status: Occasional vape user; Counseling Given?  Needs immediate cessation and possibly will need to postpone surgery.  Last Mammogram: Cannot locate record in EMR, but per consult note from 10/02/2023, mammogram history April 2025 was negative.  Caprini Score: 6; Risk Factors include: Age, BMI greater than 25, varicosities, occasional nicotine use, and length of planned surgery. Recommendation for mechanical prophylaxis. Encourage early ambulation.   Pictures obtained: 10/02/2023  Post-op Rx sent to pharmacy: Will hold off until surgery is confirmed.  Patient was provided with the General Surgical Risk consent document and Pain Medication Agreement prior to their appointment.  They had adequate time to read through the risk consent documents and Pain Medication Agreement. We also discussed them in person together during this preop appointment. All of their questions were answered to their satisfaction.  Recommended calling if they have any further questions.  Risk consent  form and Pain Medication Agreement to be scanned into patient's chart.  The risk that can be encountered with breast reduction were discussed and include  the following but not limited to these:  Breast asymmetry, fluid accumulation, firmness of the breast, inability to breast feed, loss of nipple or areola, skin loss, decrease or no nipple sensation, fat necrosis of the breast tissue, bleeding, infection, healing delay.  There are risks of anesthesia, changes to skin sensation and injury to nerves or blood vessels.  The muscle can be temporarily or permanently injured.  You may have an allergic reaction to tape, suture, glue, blood products which can result in skin discoloration, swelling, pain, skin lesions, poor healing.  Any of these can lead to the need for revisonal surgery or stage procedures.  A reduction has potential to interfere with diagnostic procedures.  Nipple or breast piercing can increase risks of infection.  This procedure is best done when the breast is fully developed.  Changes in the breast will continue to occur over time.  Pregnancy can alter the outcomes of previous breast reduction surgery, weight gain and weigh loss can also effect the long term appearance.   The risks that can be encountered with and after liposuction were discussed and include the following but no limited to these:  Asymmetry, fluid accumulation, firmness of the area, fat necrosis with death of fat tissue, bleeding, infection, delayed healing, anesthesia risks, skin sensation changes, injury to structures including nerves, blood vessels, and muscles which may be temporary or permanent, allergies to tape, suture materials and glues, blood products, topical preparations or injected agents, skin and contour irregularities, skin discoloration and swelling, deep vein thrombosis, cardiac and pulmonary complications, pain, which may persist, persistent pain, recurrence of the lesion, poor healing of the incision, possible  need for revisional surgery or staged procedures. Thiere can also be persistent swelling, poor wound healing, rippling or loose skin, worsening of cellulite, swelling, and thermal burn or heat injury from ultrasound with the ultrasound-assisted lipoplasty technique. Any change in weight fluctuations can alter the outcome.    Electronically signed by: Honora Seip, PA-C 02/20/2024 12:19 PM

## 2024-02-24 ENCOUNTER — Ambulatory Visit: Admitting: Plastic Surgery

## 2024-02-24 DIAGNOSIS — N62 Hypertrophy of breast: Secondary | ICD-10-CM | POA: Diagnosis not present

## 2024-02-24 NOTE — Progress Notes (Signed)
   Subjective:    Patient ID: [name], female    DOB: 1975/03/30, 48 y.o.   MRN: [id number]  The patient is a 48 year old female joining me by phone.  She was in [place]  and I was at the office.  She had questions about her breast reduction and wanted to be sure enough was taken off but not so much that she would look awkward with her body habitus.  She also asked about the axillary area.  She understands that I can do liposuction there but the excision will need to be done separately.  The main reason is the concern for blood supply.      Review of Systems  Constitutional: Negative.   Eyes: Negative.   Respiratory: Negative.    Cardiovascular: Negative.   Endocrine: Negative.        Objective:   Physical Exam      Assessment & Plan:     ICD-10-CM   1. Symptomatic mammary hypertrophy  N62       I connected with  Kersti P Dionisio on 02/24/24 by phone and verified that I am speaking with the correct person using two identifiers.  We spent 5 to 10 minutes in discussion.  The patient would like to proceed with bilateral breast reduction.   I discussed the limitations of evaluation and management by telemedicine. The patient expressed understanding and agreed to proceed.

## 2024-03-03 ENCOUNTER — Encounter (HOSPITAL_BASED_OUTPATIENT_CLINIC_OR_DEPARTMENT_OTHER): Payer: Self-pay | Admitting: Plastic Surgery

## 2024-03-03 ENCOUNTER — Other Ambulatory Visit: Payer: Self-pay

## 2024-03-10 ENCOUNTER — Ambulatory Visit (HOSPITAL_BASED_OUTPATIENT_CLINIC_OR_DEPARTMENT_OTHER): Admission: RE | Admit: 2024-03-10 | Source: Home / Self Care | Admitting: Plastic Surgery

## 2024-03-10 DIAGNOSIS — Z01818 Encounter for other preprocedural examination: Secondary | ICD-10-CM

## 2024-03-10 SURGERY — BREAST REDUCTION WITH LIPOSUCTION
Anesthesia: Choice | Site: Breast | Laterality: Bilateral

## 2024-03-19 ENCOUNTER — Encounter: Admitting: Surgical

## 2024-04-02 ENCOUNTER — Encounter: Admitting: Plastic Surgery

## 2024-08-03 ENCOUNTER — Ambulatory Visit: Admitting: Neurology
